# Patient Record
Sex: Female | Born: 1995 | Race: Black or African American | Hispanic: No | Marital: Single | Smoking: Never smoker
Health system: Southern US, Community
[De-identification: ages and names within clinical notes are randomized; demographics above are authoritative.]

## PROBLEM LIST (undated history)

## (undated) ENCOUNTER — Inpatient Hospital Stay (HOSPITAL_COMMUNITY): Payer: Self-pay

## (undated) DIAGNOSIS — O139 Gestational [pregnancy-induced] hypertension without significant proteinuria, unspecified trimester: Secondary | ICD-10-CM

## (undated) DIAGNOSIS — F419 Anxiety disorder, unspecified: Secondary | ICD-10-CM

## (undated) HISTORY — PX: NO PAST SURGERIES: SHX2092

---
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
## 2015-02-20 ENCOUNTER — Encounter (HOSPITAL_COMMUNITY): Payer: Self-pay | Admitting: Family Medicine

## 2015-02-20 ENCOUNTER — Emergency Department (INDEPENDENT_AMBULATORY_CARE_PROVIDER_SITE_OTHER)
Admission: EM | Admit: 2015-02-20 | Discharge: 2015-02-20 | Disposition: A | Discharge status: Home / Self Care | Payer: Self-pay | Source: Home / Self Care | Attending: Family Medicine | Admitting: Family Medicine

## 2015-02-20 DIAGNOSIS — A084 Viral intestinal infection, unspecified: Secondary | ICD-10-CM

## 2015-02-20 LAB — POCT URINALYSIS DIP (DEVICE)
Bilirubin Urine: NEGATIVE
Glucose, UA: NEGATIVE mg/dL
Hgb urine dipstick: NEGATIVE
Ketones, ur: NEGATIVE mg/dL
Nitrite: NEGATIVE
Protein, ur: 100 mg/dL — AB
Specific Gravity, Urine: >=1.03 (ref 1.005–1.030)
Urobilinogen, UA: 0.2 mg/dL (ref 0.0–1.0)
pH: 6 (ref 5.0–8.0)

## 2015-02-20 LAB — POCT PREGNANCY, URINE: Preg Test, Ur: NEGATIVE

## 2015-02-20 MED ORDER — ONDANSETRON HCL 4 MG PO TABS: 4.0000 mg | ORAL_TABLET | Freq: Three times a day (TID) | ORAL | Status: DC | PRN

## 2015-02-20 NOTE — Discharge Instructions (Signed)
You likely have a viral illness. This should go away in 1-3 days Please use the zofran for nausea and vomiting Please use birth control every time.

## 2015-02-20 NOTE — ED Provider Notes (Signed)
CSN: [id number]     Arrival date & time 02/20/15  1300 History   First MD Initiated Contact with Patient 02/20/15 1322     Chief Complaint  Patient presents with  . Emesis   (Consider location/radiation/quality/duration/timing/severity/associated sxs/prior Treatment) HPI  Nausea: started today. No different then at onset.  Associated w/ mild dizziness and flush feeling  Started after getting to work nyquil for first time last night 3 bouts of non-bloody non-bilious emesis today   LMP 2 wks ago.  Sexually active Condoms  intermittently Came off birth control several months ago  Denies dysuria or frequency abd pain, fever, HA.     History reviewed. No pertinent past medical history. History reviewed. No pertinent past surgical history. Family History  Problem Relation Age of Onset  . Hypertension Other    Social History  Substance Use Topics  . Smoking status: Never Smoker   . Smokeless tobacco: None  . Alcohol Use: No   OB History    No data available     Review of Systems Per HPI with all other pertinent systems negative.   Allergies  Review of patient's allergies indicates no known allergies.  Home Medications   Prior to Admission medications   Medication Sig Start Date End Date Taking? Authorizing Provider  ondansetron (ZOFRAN) 4 MG tablet Take 1 tablet (4 mg total) by mouth every 8 (eight) hours as needed for nausea or vomiting. 02/20/15   Ozella Rocksavid J Tranesha Lessner, MD   Meds Ordered and Administered this Visit  Medications - No data to display  BP 104/70 mmHg  Pulse 112  Temp(Src) 98.9 F (37.2 C) (Oral)  Resp 12  SpO2 100% No data found.   Physical Exam Physical Exam  Constitutional: oriented to person, place, and time. appears well-developed and well-nourished. No distress.  HENT:  Head: Normocephalic and atraumatic.  Eyes: EOMI. PERRL.  Neck: Normal range of motion.  Cardiovascular: RRR, no m/r/g, 2+ distal pulses,  Pulmonary/Chest: Effort  normal and breath sounds normal. No respiratory distress.  Abdominal: Soft. Bowel sounds are normal. NonTTP, no distension.  Musculoskeletal: Normal range of motion. Non ttp, no effusion.  Neurological: alert and oriented to person, place, and time.  Skin: Skin is warm. No rash noted. non diaphoretic.  Psychiatric: normal mood and affect. behavior is normal. Judgment and thought content normal.   ED Course  Procedures (including critical care time)  Labs Review Labs Reviewed  POCT URINALYSIS DIP (DEVICE) - Abnormal; Notable for the following:    Protein, ur 100 (*)    Leukocytes, UA TRACE (*)    All other components within normal limits  POCT PREGNANCY, URINE    Imaging Review No results found.   Visual Acuity Review  Right Eye Distance:   Left Eye Distance:   Bilateral Distance:    Right Eye Near:   Left Eye Near:    Bilateral Near:         MDM   1. Viral gastroenteritis    zofran Fluids Rest Work  Note provided    Ozella Rocksavid J Kailee Essman, MD 02/20/15 1341

## 2015-02-20 NOTE — ED Notes (Signed)
Reports vomiting this morning, now feeling dizzy, having hot flashes.  Stomach pain, sore throat.

## 2016-02-17 ENCOUNTER — Other Ambulatory Visit (HOSPITAL_COMMUNITY): Payer: Self-pay | Admitting: Nurse Practitioner

## 2016-02-17 DIAGNOSIS — IMO0002 Reserved for concepts with insufficient information to code with codable children: Secondary | ICD-10-CM

## 2016-02-17 DIAGNOSIS — O351XX0 Maternal care for (suspected) chromosomal abnormality in fetus, not applicable or unspecified: Secondary | ICD-10-CM

## 2016-02-17 DIAGNOSIS — Z3A12 12 weeks gestation of pregnancy: Secondary | ICD-10-CM

## 2016-02-17 LAB — OB RESULTS CONSOLE GC/CHLAMYDIA
CHLAMYDIA, DNA PROBE: POSITIVE
Gonorrhea: NEGATIVE

## 2016-02-17 LAB — OB RESULTS CONSOLE RUBELLA ANTIBODY, IGM: RUBELLA: IMMUNE

## 2016-02-17 LAB — OB RESULTS CONSOLE HEPATITIS B SURFACE ANTIGEN: HEP B S AG: NEGATIVE

## 2016-02-17 LAB — OB RESULTS CONSOLE HIV ANTIBODY (ROUTINE TESTING): HIV: NONREACTIVE

## 2016-02-17 LAB — OB RESULTS CONSOLE ABO/RH: RH TYPE: POSITIVE

## 2016-02-17 LAB — OB RESULTS CONSOLE RPR: RPR: NONREACTIVE

## 2016-02-25 ENCOUNTER — Encounter (HOSPITAL_COMMUNITY): Payer: Self-pay | Admitting: Nurse Practitioner

## 2016-02-27 ENCOUNTER — Inpatient Hospital Stay (HOSPITAL_COMMUNITY)
Admission: AD | Admit: 2016-02-27 | Discharge: 2016-02-27 | Disposition: A | Discharge status: Home / Self Care | Payer: Medicaid Other | Source: Ambulatory Visit | Attending: Obstetrics & Gynecology | Admitting: Obstetrics & Gynecology

## 2016-02-27 ENCOUNTER — Encounter (HOSPITAL_COMMUNITY): Payer: Self-pay | Admitting: *Deleted

## 2016-02-27 DIAGNOSIS — B3731 Acute candidiasis of vulva and vagina: Secondary | ICD-10-CM

## 2016-02-27 DIAGNOSIS — B373 Candidiasis of vulva and vagina: Secondary | ICD-10-CM | POA: Insufficient documentation

## 2016-02-27 DIAGNOSIS — Z3A12 12 weeks gestation of pregnancy: Secondary | ICD-10-CM | POA: Diagnosis not present

## 2016-02-27 DIAGNOSIS — O98811 Other maternal infectious and parasitic diseases complicating pregnancy, first trimester: Secondary | ICD-10-CM | POA: Diagnosis not present

## 2016-02-27 DIAGNOSIS — R42 Dizziness and giddiness: Secondary | ICD-10-CM | POA: Diagnosis present

## 2016-02-27 LAB — URINE MICROSCOPIC-ADD ON

## 2016-02-27 LAB — POCT PREGNANCY, URINE: Preg Test, Ur: POSITIVE — AB

## 2016-02-27 LAB — URINALYSIS, ROUTINE W REFLEX MICROSCOPIC
Bilirubin Urine: NEGATIVE
GLUCOSE, UA: NEGATIVE mg/dL
KETONES UR: NEGATIVE mg/dL
NITRITE: NEGATIVE
PROTEIN: NEGATIVE mg/dL
Specific Gravity, Urine: 1.02 (ref 1.005–1.030)
pH: 7 (ref 5.0–8.0)

## 2016-02-27 MED ORDER — TERCONAZOLE 0.8 % VA CREA: 1.0000 | TOPICAL_CREAM | Freq: Every day | VAGINAL | 0 refills | Status: DC

## 2016-02-27 NOTE — MAU Provider Note (Signed)
History     CSN: [id number]  Arrival date and time: 02/27/16 1117   First Provider Initiated Contact with Patient 02/27/16 1309      Chief Complaint  Patient presents with  . Dizziness   HPI Grace Becker is a 20 y.o. G1P0 at [redacted]w[redacted]d who presents for episode of dizziness this morning. Episode occurred upon wakening & was associated with headache and feeling of warmth. Lasted for less than a minute. No symptoms since then. Denies n/v/d, constipation, fever, chest pain, palpitations, abdominal pain, vaginal bleeding, vaginal discharge, or dysuria. Pt goes to [facility] & is scheduled for ultrasound next week.   OB History    Gravida Para Term Preterm AB Living   1             SAB TAB Ectopic Multiple Live Births                  Past Medical History:  Diagnosis Date  . Medical history non-contributory     Past Surgical History:  Procedure Laterality Date  . NO PAST SURGERIES      Family History  Problem Relation Age of Onset  . Hypertension Other     Social History  Substance Use Topics  . Smoking status: Never Smoker  . Smokeless tobacco: Never Used  . Alcohol use No    Allergies: No Known Allergies  Prescriptions Prior to Admission  Medication Sig Dispense Refill Last Dose  . ondansetron (ZOFRAN) 4 MG tablet Take 1 tablet (4 mg total) by mouth every 8 (eight) hours as needed for nausea or vomiting. 20 tablet 0     Review of Systems  Constitutional: Negative.   Respiratory: Negative for shortness of breath.   Cardiovascular: Negative for chest pain and palpitations.  Gastrointestinal: Negative.   Genitourinary: Negative.   Neurological: Positive for dizziness (lasted for a few minutes, since resolved) and headaches (lasted for a few minutes; since resolved).   Physical Exam   Blood pressure 120/65, pulse 92, temperature 98.7 F (37.1 C), temperature source Oral, resp. rate 18, height 5' (1.524 m), weight 98 lb 12.8 oz (44.8 kg), last menstrual period  12/04/2015.  Physical Exam  Nursing note and vitals reviewed. Constitutional: She is oriented to person, place, and time. She appears well-developed and well-nourished. No distress.  HENT:  Head: Normocephalic and atraumatic.  Eyes: Conjunctivae are normal. Right eye exhibits no discharge. Left eye exhibits no discharge. No scleral icterus.  Neck: Normal range of motion.  Cardiovascular: Normal rate, regular rhythm and normal heart sounds.   No murmur heard. Respiratory: Effort normal and breath sounds normal. No respiratory distress. She has no wheezes.  GI: Soft. Bowel sounds are normal. There is no tenderness.  Neurological: She is alert and oriented to person, place, and time.  Skin: Skin is warm and dry. She is not diaphoretic.  Psychiatric: She has a normal mood and affect. Her behavior is normal. Judgment and thought content normal.    MAU Course  Procedures Results for orders placed or performed during the hospital encounter of 02/27/16 (from the past 24 hour(s))  Urinalysis, Routine w reflex microscopic (not at [facility])     Status: Abnormal   Collection Time: 02/27/16 11:45 AM  Result Value Ref Range   Color, Urine YELLOW YELLOW   APPearance CLOUDY (A) CLEAR   Specific Gravity, Urine 1.020 1.005 - 1.030   pH 7.0 5.0 - 8.0   Glucose, UA NEGATIVE NEGATIVE mg/dL   Hgb urine dipstick TRACE (  A) NEGATIVE   Bilirubin Urine NEGATIVE NEGATIVE   Ketones, ur NEGATIVE NEGATIVE mg/dL   Protein, ur NEGATIVE NEGATIVE mg/dL   Nitrite NEGATIVE NEGATIVE   Leukocytes, UA LARGE (A) NEGATIVE  Urine microscopic-add on     Status: Abnormal   Collection Time: 02/27/16 11:45 AM  Result Value Ref Range   Squamous Epithelial / LPF 6-30 (A) NONE SEEN   WBC, UA 6-30 0 - 5 WBC/hpf   RBC / HPF 0-5 0 - 5 RBC/hpf   Bacteria, UA MANY (A) NONE SEEN   Urine-Other AMORPHOUS URATES/PHOSPHATES   Pregnancy, urine POC     Status: Abnormal   Collection Time: 02/27/16 12:30 PM  Result Value Ref Range   Preg  Test, Ur POSITIVE (A) NEGATIVE    MDM FHT 160 by doppler VSS NAD Pt declined blood work; states she just wanted to make sure everything was ok and requesting ultrasound -- discussed this isn't necessary as we confirmed viability with doppler Yeast in urine micro; denies symptoms but acceptable of treatment Assessment and Plan  A; 1. Vaginal yeast infection    P; Discharge home Rx terazol Keep scheduled appt with [facility] & ultrasound Discussed reasons to return to MAU  Judeth HornErin Adelai Achey 02/27/2016, 1:09 PM

## 2016-02-27 NOTE — Discharge Instructions (Signed)

## 2016-03-03 ENCOUNTER — Ambulatory Visit (HOSPITAL_COMMUNITY): Payer: Self-pay

## 2016-03-03 ENCOUNTER — Ambulatory Visit (HOSPITAL_COMMUNITY)
Admission: RE | Admit: 2016-03-03 | Discharge: 2016-03-03 | Disposition: A | Discharge status: Home / Self Care | Payer: Self-pay | Source: Ambulatory Visit | Attending: Nurse Practitioner | Admitting: Nurse Practitioner

## 2016-03-03 ENCOUNTER — Encounter (HOSPITAL_COMMUNITY): Payer: Self-pay

## 2016-05-11 NOTE — L&D Delivery Note (Signed)
Delivery Note  The patient resumed pushing at 8:45 pm, with slow gradual progress as her pushing became more focused. Multiple positions attempted, and with hips flexed she made progress to +4 position, and requested vacuum assistance as progress slowed. At 10:33 PM a viable and healthy female was delivered via Vaginal, Kiwi vacuum assisted vaginal delivery (Presentation: ; OA ).  APGAR: 8, 9; weight  .  pending Placenta status: , .  Cord:  with the following complications: .  Cord pH: not done  Anesthesia:  Epidural, local for left labia majora repair. Several 1st degree tears,  subclitoral in midline, not bleeding and not warraning repair. Episiotomy: None Lacerations:   Suture Repair: vicryl 4-0 on SH Est. Blood Loss (mL):    Mom to postpartum.  Baby to Couplet care / Skin to Skin.  FERGUSON,JOHN V 09/11/2016, 10:52 PM

## 2016-09-01 ENCOUNTER — Inpatient Hospital Stay (HOSPITAL_COMMUNITY)
Admission: AD | Admit: 2016-09-01 | Discharge: 2016-09-01 | Disposition: A | Discharge status: Home / Self Care | Payer: Medicaid Other | Source: Ambulatory Visit | Attending: Family Medicine | Admitting: Family Medicine

## 2016-09-01 ENCOUNTER — Encounter (HOSPITAL_COMMUNITY): Payer: Self-pay | Admitting: *Deleted

## 2016-09-01 DIAGNOSIS — Z3A00 Weeks of gestation of pregnancy not specified: Secondary | ICD-10-CM | POA: Insufficient documentation

## 2016-09-01 DIAGNOSIS — O479 False labor, unspecified: Secondary | ICD-10-CM | POA: Insufficient documentation

## 2016-09-01 NOTE — Progress Notes (Signed)
I have communicated with Dr. Omer Jack and reviewed vital signs:  Vitals:   09/01/16 1421 09/01/16 1527  BP: 126/80 125/76  Pulse: 93 94  Resp: 18 16  Temp: 98.2 F (36.8 C)     Vaginal exam:  Dilation: 1 Effacement (%): Thick Cervical Position: Middle Station: -3 Presentation: Vertex Exam by:: A. Brandace Cargle, RN,   Also reviewed contraction pattern and that non-stress test is reactive.  It has been documented that patient is contracting every 7-10 minutes with cervical exam of 1/thick/-3 not indicating active labor.  Patient denies any other complaints.  Based on this report provider has given order for discharge.  A discharge order and diagnosis entered by a provider.   Labor discharge instructions reviewed with patient.

## 2016-09-01 NOTE — MAU Note (Signed)
Pt C/O lower abd pain that started around noon, thinks it may be contractions.  Denies bleeding or LOF.

## 2016-09-01 NOTE — Discharge Instructions (Signed)
Braxton Hicks Contractions °Contractions of the uterus can occur throughout pregnancy, but they are not always a sign that you are in labor. You may have practice contractions called Braxton Hicks contractions. These false labor contractions are sometimes confused with true labor. °What are Braxton Hicks contractions? °Braxton Hicks contractions are tightening movements that occur in the muscles of the uterus before labor. Unlike true labor contractions, these contractions do not result in opening (dilation) and thinning of the cervix. Toward the end of pregnancy (32-34 weeks), Braxton Hicks contractions can happen more often and may become stronger. These contractions are sometimes difficult to tell apart from true labor because they can be very uncomfortable. You should not feel embarrassed if you go to the hospital with false labor. °Sometimes, the only way to tell if you are in true labor is for your health care provider to look for changes in the cervix. The health care provider will do a physical exam and may monitor your contractions. If you are not in true labor, the exam should show that your cervix is not dilating and your water has not broken. °If there are no prenatal problems or other health problems associated with your pregnancy, it is completely safe for you to be sent home with false labor. You may continue to have Braxton Hicks contractions until you go into true labor. °How can I tell the difference between true labor and false labor? °· Differences °¨ False labor °¨ Contractions last 30-70 seconds.: Contractions are usually shorter and not as strong as true labor contractions. °¨ Contractions become very regular.: Contractions are usually irregular. °¨ Discomfort is usually felt in the top of the uterus, and it spreads to the lower abdomen and low back.: Contractions are often felt in the front of the lower abdomen and in the groin. °¨ Contractions do not go away with walking.: Contractions may  go away when you walk around or change positions while lying down. °¨ Contractions usually become more intense and increase in frequency.: Contractions get weaker and are shorter-lasting as time goes on. °¨ The cervix dilates and gets thinner.: The cervix usually does not dilate or become thin. °Follow these instructions at home: °¨ Take over-the-counter and prescription medicines only as told by your health care provider. °¨ Keep up with your usual exercises and follow other instructions from your health care provider. °¨ Eat and drink lightly if you think you are going into labor. °¨ If Braxton Hicks contractions are making you uncomfortable: °¨ Change your position from lying down or resting to walking, or change from walking to resting. °¨ Sit and rest in a tub of warm water. °¨ Drink enough fluid to keep your urine clear or pale yellow. Dehydration may cause these contractions. °¨ Do slow and deep breathing several times an hour. °¨ Keep all follow-up prenatal visits as told by your health care provider. This is important. °Contact a health care provider if: °¨ You have a fever. °¨ You have continuous pain in your abdomen. °Get help right away if: °¨ Your contractions become stronger, more regular, and closer together. °¨ You have fluid leaking or gushing from your vagina. °¨ You pass blood-tinged mucus (bloody show). °¨ You have bleeding from your vagina. °¨ You have low back pain that you never had before. °¨ You feel your baby’s head pushing down and causing pelvic pressure. °¨ Your baby is not moving inside you as much as it used to. °Summary °¨ Contractions that occur before labor are   called Braxton Hicks contractions, false labor, or practice contractions. °¨ Braxton Hicks contractions are usually shorter, weaker, farther apart, and less regular than true labor contractions. True labor contractions usually become progressively stronger and regular and they become more frequent. °¨ Manage discomfort from  Braxton Hicks contractions by changing position, resting in a warm bath, drinking plenty of water, or practicing deep breathing. °This information is not intended to replace advice given to you by your health care provider. Make sure you discuss any questions you have with your health care provider. °Document Released: 04/27/2005 Document Revised: 03/16/2016 Document Reviewed: 03/16/2016 °Elsevier Interactive Patient Education © 2017 [employer] °Fetal Movement Counts °Patient Name: ________________________________________________ Patient Due Date: ____________________ °What is a fetal movement count? °A fetal movement count is the number of times that you feel your baby move during a certain amount of time. This may also be called a fetal kick count. A fetal movement count is recommended for every pregnant woman. You may be asked to start counting fetal movements as early as week 28 of your pregnancy. °Pay attention to when your baby is most active. You may notice your baby's sleep and wake cycles. You may also notice things that make your baby move more. You should do a fetal movement count: °· When your baby is normally most active. °· At the same time each day. °A good time to count movements is while you are resting, after having something to eat and drink. °How do I count fetal movements? °1. Find a quiet, comfortable area. Sit, or lie down on your side. °2. Write down the date, the start time and stop time, and the number of movements that you felt between those two times. Take this information with you to your health care visits. °3. For 2 hours, count kicks, flutters, swishes, rolls, and jabs. You should feel at least 10 movements during 2 hours. °4. You may stop counting after you have felt 10 movements. °5. If you do not feel 10 movements in 2 hours, have something to eat and drink. Then, keep resting and counting for 1 hour. If you feel at least 4 movements during that hour, you may stop  counting. °Contact a health care provider if: °· You feel fewer than 4 movements in 2 hours. °· Your baby is not moving like he or she usually does. °Date: ____________ Start time: ____________ Stop time: ____________ Movements: ____________ °Date: ____________ Start time: ____________ Stop time: ____________ Movements: ____________ °Date: ____________ Start time: ____________ Stop time: ____________ Movements: ____________ °Date: ____________ Start time: ____________ Stop time: ____________ Movements: ____________ °Date: ____________ Start time: ____________ Stop time: ____________ Movements: ____________ °Date: ____________ Start time: ____________ Stop time: ____________ Movements: ____________ °Date: ____________ Start time: ____________ Stop time: ____________ Movements: ____________ °Date: ____________ Start time: ____________ Stop time: ____________ Movements: ____________ °Date: ____________ Start time: ____________ Stop time: ____________ Movements: ____________ °This information is not intended to replace advice given to you by your health care provider. Make sure you discuss any questions you have with your health care provider. °Document Released: 05/27/2006 Document Revised: 12/25/2015 Document Reviewed: 06/06/2015 °Elsevier Interactive Patient Education © 2017 [employer] Inc[employer] ° °

## 2016-09-07 LAB — OB RESULTS CONSOLE GBS: GBS: NEGATIVE

## 2016-09-09 ENCOUNTER — Ambulatory Visit (INDEPENDENT_AMBULATORY_CARE_PROVIDER_SITE_OTHER): Payer: Medicaid Other | Admitting: *Deleted

## 2016-09-09 ENCOUNTER — Ambulatory Visit: Payer: Self-pay

## 2016-09-09 VITALS — BP 125/77 | HR 76

## 2016-09-09 DIAGNOSIS — O480 Post-term pregnancy: Secondary | ICD-10-CM

## 2016-09-09 NOTE — Progress Notes (Signed)
Pt receives prenatal care @ [facility] and next visit is 09/11/16.  IOL scheduled on 5/8 @ 0730.  Labor precautions reviewed and pt voiced understanding. Copy of NST report faxed to [facility].

## 2016-09-09 NOTE — Progress Notes (Signed)
NST performed today was reviewed and was found to be reactive.  AFI was also normal.  Continue recommended antenatal testing and prenatal care.   Tereso Newcomer, MD

## 2016-09-10 ENCOUNTER — Encounter (HOSPITAL_COMMUNITY): Payer: Self-pay

## 2016-09-10 ENCOUNTER — Inpatient Hospital Stay (HOSPITAL_COMMUNITY)
Admission: AD | Admit: 2016-09-10 | Discharge: 2016-09-13 | DRG: 774 | Disposition: A | Discharge status: Home / Self Care | Payer: Medicaid Other | Source: Ambulatory Visit | Attending: Obstetrics and Gynecology | Admitting: Obstetrics and Gynecology

## 2016-09-10 DIAGNOSIS — O4290 Premature rupture of membranes, unspecified as to length of time between rupture and onset of labor, unspecified weeks of gestation: Secondary | ICD-10-CM | POA: Diagnosis present

## 2016-09-10 DIAGNOSIS — O4212 Full-term premature rupture of membranes, onset of labor more than 24 hours following rupture: Secondary | ICD-10-CM

## 2016-09-10 DIAGNOSIS — Z3A40 40 weeks gestation of pregnancy: Secondary | ICD-10-CM

## 2016-09-10 DIAGNOSIS — O4292 Full-term premature rupture of membranes, unspecified as to length of time between rupture and onset of labor: Secondary | ICD-10-CM | POA: Diagnosis present

## 2016-09-10 DIAGNOSIS — O4202 Full-term premature rupture of membranes, onset of labor within 24 hours of rupture: Secondary | ICD-10-CM

## 2016-09-10 DIAGNOSIS — D72829 Elevated white blood cell count, unspecified: Secondary | ICD-10-CM | POA: Diagnosis present

## 2016-09-10 DIAGNOSIS — O1495 Unspecified pre-eclampsia, complicating the puerperium: Secondary | ICD-10-CM | POA: Diagnosis not present

## 2016-09-10 LAB — POCT FERN TEST: POCT FERN TEST: POSITIVE

## 2016-09-10 NOTE — H&P (Signed)
Grace Becker is a 21 y.o. female presenting for contractions for 2 hours. While here she ruptured her membranes.  Cervix was 1cm yesterday. States UCs are very painful  RN Note: PT reports contractions that started this morning and now every 5 mins x2 hours. Pt denies LOF. Has had some spotting. States cervix was 1cm yesterday. Reports good fetal movement.     Electronically signed by Brand Males     . OB History    Gravida Para Term Preterm AB Living   1             SAB TAB Ectopic Multiple Live Births                 Past Medical History:  Diagnosis Date  . Medical history non-contributory    Past Surgical History:  Procedure Laterality Date  . NO PAST SURGERIES     Family History: family history includes Hypertension in her other. Social History:  reports that she has never smoked. She has never used smokeless tobacco. She reports that she does not drink alcohol or use drugs.     Maternal Diabetes: No Genetic Screening: Normal Maternal Ultrasounds/Referrals: Normal Fetal Ultrasounds or other Referrals:  None Maternal Substance Abuse:  No Significant Maternal Medications:  None Significant Maternal Lab Results:  Lab values include: Group B Strep negative Other Comments:  None  Review of Systems  Constitutional: Negative for chills, fever and malaise/fatigue.  Respiratory: Negative for shortness of breath.   Cardiovascular: Negative for leg swelling.  Gastrointestinal: Positive for abdominal pain. Negative for constipation, diarrhea, nausea and vomiting.  Genitourinary: Negative for dysuria.   Maternal Medical History:  Reason for admission: Rupture of membranes and contractions.  Nausea.  Contractions: Onset was 1-2 hours ago.   Frequency: regular.   Perceived severity is moderate.    Fetal activity: Perceived fetal activity is normal.   Last perceived fetal movement was within the past hour.    Prenatal complications: Bleeding.   No PIH,  pre-eclampsia or preterm labor.   Prenatal Complications - Diabetes: none.    Dilation: 2 Effacement (%): 70 Station: -3 Exam by:: Camelia Eng RN Blood pressure 134/85, pulse 78, temperature 98.4 F (36.9 C), temperature source Oral, resp. rate 18, height 5\' 2"  (1.575 m), weight 146 lb (66.2 kg), last menstrual period 12/03/2015, SpO2 100 %. Maternal Exam:  Uterine Assessment: Contraction strength is moderate.  Contraction frequency is regular.   Abdomen: Patient reports no abdominal tenderness. Fundal height is 39.   Fetal presentation: vertex  Introitus: Normal vulva. Normal vagina.  Ferning test: positive.  Nitrazine test: not done. Amniotic fluid character: clear.  Pelvis: adequate for delivery.   Cervix: Cervix evaluated by digital exam.     Fetal Exam Fetal Monitor Review: Mode: ultrasound.   Baseline rate: 140.  Variability: moderate (6-25 bpm).   Pattern: accelerations present and no decelerations.    Fetal State Assessment: Category I - tracings are normal.     Physical Exam  Constitutional: She is oriented to person, place, and time. She appears well-developed and well-nourished. No distress.  HENT:  Head: Normocephalic.  Cardiovascular: Normal rate and regular rhythm.   Respiratory: Effort normal. No respiratory distress.  GI: Soft. She exhibits no distension. There is no tenderness. There is no rebound and no guarding.  Genitourinary:  Genitourinary Comments: Dilation: 2 Effacement (%): 70 Cervical Position: Middle Station: -3 Exam by:: Camelia Eng RN   Musculoskeletal: Normal range of motion.  Neurological: She  is alert and oriented to person, place, and time.  Skin: Skin is warm and dry.  Psychiatric: She has a normal mood and affect.    Prenatal labs: ABO, Rh:   Antibody:   Rubella:   RPR:    HBsAg:    HIV:    GBS: Negative (04/30 0000)   Assessment/Plan: Single IUP at 4769w2d Latent phase labor PROM at term  [facility] Rout[facility] orders   Wynelle BourgeoisMarie Williams 09/10/2016, 11:56 PM

## 2016-09-10 NOTE — MAU Note (Signed)
PT reports contractions that started this morning and now every 5 mins x2 hours. Pt denies LOF. Has had some spotting. States cervix was 1cm yesterday. Reports good fetal movement.

## 2016-09-11 ENCOUNTER — Inpatient Hospital Stay (HOSPITAL_COMMUNITY): Payer: Medicaid Other | Admitting: Anesthesiology

## 2016-09-11 ENCOUNTER — Encounter (HOSPITAL_COMMUNITY): Payer: Self-pay

## 2016-09-11 DIAGNOSIS — O4292 Full-term premature rupture of membranes, unspecified as to length of time between rupture and onset of labor: Secondary | ICD-10-CM | POA: Diagnosis present

## 2016-09-11 DIAGNOSIS — O165 Unspecified maternal hypertension, complicating the puerperium: Secondary | ICD-10-CM | POA: Diagnosis present

## 2016-09-11 DIAGNOSIS — Z3493 Encounter for supervision of normal pregnancy, unspecified, third trimester: Secondary | ICD-10-CM | POA: Diagnosis present

## 2016-09-11 DIAGNOSIS — R0602 Shortness of breath: Secondary | ICD-10-CM | POA: Diagnosis not present

## 2016-09-11 DIAGNOSIS — O1495 Unspecified pre-eclampsia, complicating the puerperium: Secondary | ICD-10-CM | POA: Diagnosis not present

## 2016-09-11 DIAGNOSIS — Z8249 Family history of ischemic heart disease and other diseases of the circulatory system: Secondary | ICD-10-CM | POA: Diagnosis not present

## 2016-09-11 DIAGNOSIS — O4290 Premature rupture of membranes, unspecified as to length of time between rupture and onset of labor, unspecified weeks of gestation: Secondary | ICD-10-CM | POA: Diagnosis present

## 2016-09-11 DIAGNOSIS — Z79899 Other long term (current) drug therapy: Secondary | ICD-10-CM | POA: Diagnosis not present

## 2016-09-11 DIAGNOSIS — D72829 Elevated white blood cell count, unspecified: Secondary | ICD-10-CM | POA: Diagnosis present

## 2016-09-11 DIAGNOSIS — Z3A40 40 weeks gestation of pregnancy: Secondary | ICD-10-CM | POA: Diagnosis not present

## 2016-09-11 LAB — COMPREHENSIVE METABOLIC PANEL
ALBUMIN: 2.9 g/dL — AB (ref 3.5–5.0)
ALK PHOS: 133 U/L — AB (ref 38–126)
ALT: 10 U/L — AB (ref 14–54)
AST: 22 U/L (ref 15–41)
Anion gap: 10 (ref 5–15)
BILIRUBIN TOTAL: 0.8 mg/dL (ref 0.3–1.2)
CALCIUM: 8.4 mg/dL — AB (ref 8.9–10.3)
CO2: 20 mmol/L — ABNORMAL LOW (ref 22–32)
CREATININE: 0.6 mg/dL (ref 0.44–1.00)
Chloride: 102 mmol/L (ref 101–111)
GFR calc Af Amer: >60 mL/min (ref >60)
GLUCOSE: 97 mg/dL (ref 65–99)
POTASSIUM: 3.4 mmol/L — AB (ref 3.5–5.1)
Sodium: 132 mmol/L — ABNORMAL LOW (ref 135–145)
TOTAL PROTEIN: 6.8 g/dL (ref 6.5–8.1)

## 2016-09-11 LAB — RPR: RPR Ser Ql: NONREACTIVE

## 2016-09-11 LAB — ABO/RH: ABO/RH(D): A POS

## 2016-09-11 LAB — CBC
HEMATOCRIT: 27.5 % — AB (ref 36.0–46.0)
HEMATOCRIT: 28.4 % — AB (ref 36.0–46.0)
HEMOGLOBIN: 9.1 g/dL — AB (ref 12.0–15.0)
Hemoglobin: 9.2 g/dL — ABNORMAL LOW (ref 12.0–15.0)
MCH: 24.8 pg — ABNORMAL LOW (ref 26.0–34.0)
MCH: 25.5 pg — AB (ref 26.0–34.0)
MCHC: 32.4 g/dL (ref 30.0–36.0)
MCHC: 33.1 g/dL (ref 30.0–36.0)
MCV: 76.5 fL — ABNORMAL LOW (ref 78.0–100.0)
MCV: 77 fL — ABNORMAL LOW (ref 78.0–100.0)
PLATELETS: 220 10*3/uL (ref 150–400)
Platelets: 217 10*3/uL (ref 150–400)
RBC: 3.57 MIL/uL — ABNORMAL LOW (ref 3.87–5.11)
RBC: 3.71 MIL/uL — ABNORMAL LOW (ref 3.87–5.11)
RDW: 15.5 % (ref 11.5–15.5)
RDW: 15.7 % — AB (ref 11.5–15.5)
WBC: 14.1 10*3/uL — ABNORMAL HIGH (ref 4.0–10.5)
WBC: 21.6 10*3/uL — ABNORMAL HIGH (ref 4.0–10.5)

## 2016-09-11 LAB — TYPE AND SCREEN
ABO/RH(D): A POS
ANTIBODY SCREEN: NEGATIVE

## 2016-09-11 LAB — PROTEIN / CREATININE RATIO, URINE
Creatinine, Urine: 62 mg/dL
Protein Creatinine Ratio: 0.79 mg/mg{Cre} — ABNORMAL HIGH (ref 0.00–0.15)
Total Protein, Urine: 49 mg/dL

## 2016-09-11 MED ORDER — PHENYLEPHRINE 40 MCG/ML (10ML) SYRINGE FOR IV PUSH (FOR BLOOD PRESSURE SUPPORT)
80.0000 ug | PREFILLED_SYRINGE | INTRAVENOUS | Status: DC | PRN
  Filled 2016-09-11: qty 5

## 2016-09-11 MED ORDER — FENTANYL CITRATE (PF) 100 MCG/2ML IJ SOLN
INTRAMUSCULAR | Status: AC
Start: 2016-09-11 — End: 2016-09-11
  Filled 2016-09-11: qty 2

## 2016-09-11 MED ORDER — LIDOCAINE HCL (PF) 1 % IJ SOLN
30.0000 mL | INTRAMUSCULAR | Status: DC | PRN
  Administered 2016-09-11: 30 mL via SUBCUTANEOUS
  Filled 2016-09-11: qty 30

## 2016-09-11 MED ORDER — FENTANYL 2.5 MCG/ML BUPIVACAINE 1/10 % EPIDURAL INFUSION (WH - ANES)
14.0000 mL/h | INTRAMUSCULAR | Status: DC | PRN
  Administered 2016-09-11 (×3): 14 mL/h via EPIDURAL
  Filled 2016-09-11 (×4): qty 100

## 2016-09-11 MED ORDER — LACTATED RINGERS IV SOLN
500.0000 mL | INTRAVENOUS | Status: DC | PRN
  Administered 2016-09-11: 500 mL via INTRAVENOUS

## 2016-09-11 MED ORDER — MAGNESIUM SULFATE BOLUS VIA INFUSION
4.0000 g | Freq: Once | INTRAVENOUS | Status: AC
  Administered 2016-09-12: 4 g via INTRAVENOUS
  Filled 2016-09-11: qty 500

## 2016-09-11 MED ORDER — OXYTOCIN 40 UNITS IN LACTATED RINGERS INFUSION - SIMPLE MED
1.0000 m[IU]/min | INTRAVENOUS | Status: DC
  Administered 2016-09-11: 2 m[IU]/min via INTRAVENOUS

## 2016-09-11 MED ORDER — LACTATED RINGERS IV SOLN: 500.0000 mL | Freq: Once | INTRAVENOUS | Status: DC

## 2016-09-11 MED ORDER — MAGNESIUM SULFATE 40 G IN LACTATED RINGERS - SIMPLE
2.0000 g/h | INTRAVENOUS | Status: AC
  Administered 2016-09-12 (×2): 2 g/h via INTRAVENOUS
  Filled 2016-09-11 (×2): qty 500

## 2016-09-11 MED ORDER — LABETALOL HCL 5 MG/ML IV SOLN
20.0000 mg | Freq: Once | INTRAVENOUS | Status: DC
  Filled 2016-09-11: qty 4

## 2016-09-11 MED ORDER — OXYCODONE-ACETAMINOPHEN 5-325 MG PO TABS: 1.0000 | ORAL_TABLET | ORAL | Status: DC | PRN

## 2016-09-11 MED ORDER — LACTATED RINGERS IV SOLN
INTRAVENOUS | Status: DC
  Administered 2016-09-11: 07:00:00 via INTRAVENOUS
  Administered 2016-09-11: 125 mL/h via INTRAVENOUS

## 2016-09-11 MED ORDER — FENTANYL CITRATE (PF) 100 MCG/2ML IJ SOLN
100.0000 ug | Freq: Once | INTRAMUSCULAR | Status: AC
  Administered 2016-09-11: 100 ug via INTRAVENOUS

## 2016-09-11 MED ORDER — PHENYLEPHRINE 40 MCG/ML (10ML) SYRINGE FOR IV PUSH (FOR BLOOD PRESSURE SUPPORT)
80.0000 ug | PREFILLED_SYRINGE | INTRAVENOUS | Status: DC | PRN
  Filled 2016-09-11: qty 10
  Filled 2016-09-11: qty 5
  Filled 2016-09-11: qty 10

## 2016-09-11 MED ORDER — DIPHENHYDRAMINE HCL 50 MG/ML IJ SOLN: 12.5000 mg | INTRAMUSCULAR | Status: DC | PRN

## 2016-09-11 MED ORDER — OXYCODONE-ACETAMINOPHEN 5-325 MG PO TABS: 2.0000 | ORAL_TABLET | ORAL | Status: DC | PRN

## 2016-09-11 MED ORDER — OXYTOCIN 40 UNITS IN LACTATED RINGERS INFUSION - SIMPLE MED
2.5000 [IU]/h | INTRAVENOUS | Status: DC
  Filled 2016-09-11: qty 1000

## 2016-09-11 MED ORDER — ONDANSETRON HCL 4 MG/2ML IJ SOLN: 4.0000 mg | Freq: Four times a day (QID) | INTRAMUSCULAR | Status: DC | PRN

## 2016-09-11 MED ORDER — ACETAMINOPHEN 325 MG PO TABS: 650.0000 mg | ORAL_TABLET | ORAL | Status: DC | PRN

## 2016-09-11 MED ORDER — OXYTOCIN BOLUS FROM INFUSION
500.0000 mL | Freq: Once | INTRAVENOUS | Status: AC
  Administered 2016-09-11: 500 mL via INTRAVENOUS

## 2016-09-11 MED ORDER — EPHEDRINE 5 MG/ML INJ
10.0000 mg | INTRAVENOUS | Status: DC | PRN
  Filled 2016-09-11: qty 2

## 2016-09-11 MED ORDER — SOD CITRATE-CITRIC ACID 500-334 MG/5ML PO SOLN: 30.0000 mL | ORAL | Status: DC | PRN

## 2016-09-11 MED ORDER — TERBUTALINE SULFATE 1 MG/ML IJ SOLN: 0.2500 mg | Freq: Once | INTRAMUSCULAR | Status: DC | PRN

## 2016-09-11 MED ORDER — LIDOCAINE HCL (PF) 1 % IJ SOLN
INTRAMUSCULAR | Status: DC | PRN
Start: 2016-09-11 — End: 2016-09-11
  Administered 2016-09-11: 7 mL via EPIDURAL
  Administered 2016-09-11: 5 mL via EPIDURAL

## 2016-09-11 NOTE — Progress Notes (Signed)
Delivery Note  The patient resumed pushing at 8:45 pm, with slow gradual progress as her pushing became more focused. Multiple positions attempted, and with hips flexed she made progress to +4 position, and requested vacuum assistance as progress slowed. At 10:33 PM a viable and healthy female was delivered via Vaginal, Kiwi vacuum assisted vaginal delivery (Presentation: ; OA ).  APGAR: 8, 9; weight  .  pending Placenta status: , .  Cord:  with the following complications: .  Cord pH: not done  Anesthesia:  Epidural, local for left labia majora repair. Several 1st degree tears,  subclitoral in midline, not bleeding and not warraning repair. Episiotomy: None Lacerations:   Suture Repair: vicryl 4-0 on SH Est. Blood Loss (mL):    Mom to postpartum.  Baby to Couplet care / Skin to Skin.  Grace Becker,Grace Becker 09/11/2016, 10:52 PM  Patient ID: Grace Becker, female   DOB: 06/06/1995, 21 y.o.   MRN: [id number]

## 2016-09-11 NOTE — Anesthesia Preprocedure Evaluation (Signed)
Anesthesia Evaluation  Patient identified by MRN, date of birth, ID band Patient awake    Reviewed: Allergy & Precautions, H&P , NPO status , Patient's Chart, lab work & pertinent test results  Airway Mallampati: I  TM Distance: >3 FB Neck ROM: full    Dental no notable dental hx.    Pulmonary neg pulmonary ROS,    Pulmonary exam normal breath sounds clear to auscultation       Cardiovascular negative cardio ROS Normal cardiovascular exam Rhythm:Regular Rate:Normal     Neuro/Psych negative neurological ROS  negative psych ROS   GI/Hepatic negative GI ROS, Neg liver ROS,   Endo/Other  negative endocrine ROS  Renal/GU negative Renal ROS  negative genitourinary   Musculoskeletal negative musculoskeletal ROS (+)   Abdominal Normal abdominal exam  (+)   Peds negative pediatric ROS (+)  Hematology negative hematology ROS (+)   Anesthesia Other Findings   Reproductive/Obstetrics (+) Pregnancy                             Anesthesia Physical Anesthesia Plan  ASA: II  Anesthesia Plan: Epidural   Post-op Pain Management:    Induction:   Airway Management Planned:   Additional Equipment:   Intra-op Plan:   Post-operative Plan:   Informed Consent: I have reviewed the patients History and Physical, chart, labs and discussed the procedure including the risks, benefits and alternatives for the proposed anesthesia with the patient or authorized representative who has indicated his/her understanding and acceptance.     Plan Discussed with:   Anesthesia Plan Comments:         Anesthesia Quick Evaluation

## 2016-09-11 NOTE — Progress Notes (Signed)
Patient seen. Doing well. No complaints. Will contineu to monitor. Making cervical change on her own. Will plan to start pitocin if progress stops. No other concerns.   Grace PennaNicholas Lyvonne Cassell, MD 09/11/16 9:37 AM

## 2016-09-11 NOTE — Anesthesia Procedure Notes (Signed)
Epidural Patient location during procedure: OB Start time: 09/11/2016 4:29 AM End time: 09/11/2016 4:33 AM  Staffing Anesthesiologist: Leilani AbleHATCHETT, Linsy Ehresman Performed: anesthesiologist   Preanesthetic Checklist Completed: patient identified, surgical consent, pre-op evaluation, timeout performed, IV checked, risks and benefits discussed and monitors and equipment checked  Epidural Patient position: sitting Prep: site prepped and draped and DuraPrep Patient monitoring: continuous pulse ox and blood pressure Approach: midline Location: L3-L4 Injection technique: LOR air  Needle:  Needle type: Tuohy  Needle gauge: 17 G Needle length: 9 cm and 9 Needle insertion depth: 5 cm cm Catheter type: closed end flexible Catheter size: 19 Gauge Catheter at skin depth: 10 cm Test dose: negative and Other  Assessment Sensory level: T9 Events: blood not aspirated, injection not painful, no injection resistance, negative IV test and no paresthesia  Additional Notes Reason for block:procedure for pain

## 2016-09-11 NOTE — Progress Notes (Signed)
Patient ID: Grace Becker, female   DOB: 04/14/1996, 21 y.o.   MRN: [id number] Hurting more with contractions  Vitals:   09/10/16 2236 09/11/16 0045 09/11/16 0046  BP: 134/85  140/80  Pulse: 78  85  Resp: 18 18   Temp: 98.4 F (36.9 C) 98.4 F (36.9 C)   TempSrc: Oral Oral   SpO2: 100%    Weight: 146 lb (66.2 kg) 146 lb (66.2 kg)   Height: 5\' 2"  (1.575 m) 5\' 2"  (1.575 m)    FHR reassuring UCs every 2-3 min  Dilation: 3 Effacement (%): 90 Cervical Position: Middle Station: -3 Presentation: Vertex Exam by:: Naziah Weckerly CNM  Foley bulb inserted Now wants epidural

## 2016-09-11 NOTE — Anesthesia Pain Management Evaluation Note (Signed)
  CRNA Pain Management Visit Note  Patient: Grace Becker, 21 y.o., female  "Hello I am a member of the anesthesia team at [facility]. We have an anesthesia team available at all times to provide care throughout the hospital, including epidural management and anesthesia for C-section. I don't know your plan for the delivery whether it a natural birth, water birth, IV sedation, nitrous supplementation, doula or epidural, but we want to meet your pain goals."   1.Was your pain managed to your expectations on prior hospitalizations?   No prior hospitalizations  2.What is your expectation for pain management during this hospitalization?     Epidural  3.How can we help you reach that goal?epidural  Record the patient's initial score and the patient's pain goal.   Pain: 0  Pain Goal: 4 [facility] wants you to be able to say your pain was always managed very well.  [name] 09/11/2016

## 2016-09-11 NOTE — Progress Notes (Signed)
Patient seen per nursing report patient is not pushing effectively. She has attempted multiple positions and tug of war with no effect. I pushed with patient through several contractions and noted no effective pushing even with extensive coaching. Will labor down for an how and resume pushing. Asked nursing to avoid patient controlled doses on epidrual during this time.  Ernestina PennaNicholas Schenk, MD 09/11/16 6:30 PM

## 2016-09-12 ENCOUNTER — Encounter (HOSPITAL_COMMUNITY): Payer: Self-pay

## 2016-09-12 DIAGNOSIS — Z3A40 40 weeks gestation of pregnancy: Secondary | ICD-10-CM

## 2016-09-12 LAB — CBC
HCT: 26.8 % — ABNORMAL LOW (ref 36.0–46.0)
Hemoglobin: 8.9 g/dL — ABNORMAL LOW (ref 12.0–15.0)
MCH: 25.3 pg — ABNORMAL LOW (ref 26.0–34.0)
MCHC: 33.2 g/dL (ref 30.0–36.0)
MCV: 76.1 fL — ABNORMAL LOW (ref 78.0–100.0)
PLATELETS: 194 10*3/uL (ref 150–400)
RBC: 3.52 MIL/uL — AB (ref 3.87–5.11)
RDW: 15.6 % — ABNORMAL HIGH (ref 11.5–15.5)
WBC: 31.4 10*3/uL — ABNORMAL HIGH (ref 4.0–10.5)

## 2016-09-12 LAB — URINALYSIS, ROUTINE W REFLEX MICROSCOPIC
Bilirubin Urine: NEGATIVE
Glucose, UA: NEGATIVE mg/dL
Ketones, ur: NEGATIVE mg/dL
NITRITE: NEGATIVE
PROTEIN: 30 mg/dL — AB
SPECIFIC GRAVITY, URINE: 1.006 (ref 1.005–1.030)
pH: 5 (ref 5.0–8.0)

## 2016-09-12 MED ORDER — OXYCODONE HCL 5 MG PO TABS: 5.0000 mg | ORAL_TABLET | ORAL | Status: DC | PRN

## 2016-09-12 MED ORDER — DOCUSATE SODIUM 100 MG PO CAPS
100.0000 mg | ORAL_CAPSULE | Freq: Two times a day (BID) | ORAL | Status: DC
  Administered 2016-09-12 (×2): 100 mg via ORAL
  Filled 2016-09-12 (×3): qty 1

## 2016-09-12 MED ORDER — DIBUCAINE 1 % RE OINT: 1.0000 "application " | TOPICAL_OINTMENT | RECTAL | Status: DC | PRN

## 2016-09-12 MED ORDER — ONDANSETRON HCL 4 MG PO TABS: 4.0000 mg | ORAL_TABLET | ORAL | Status: DC | PRN

## 2016-09-12 MED ORDER — IBUPROFEN 600 MG PO TABS
600.0000 mg | ORAL_TABLET | Freq: Four times a day (QID) | ORAL | Status: DC
  Administered 2016-09-12 – 2016-09-13 (×5): 600 mg via ORAL
  Filled 2016-09-12 (×6): qty 1

## 2016-09-12 MED ORDER — FERROUS SULFATE 325 (65 FE) MG PO TABS
325.0000 mg | ORAL_TABLET | Freq: Two times a day (BID) | ORAL | Status: DC
  Administered 2016-09-12 (×2): 325 mg via ORAL
  Filled 2016-09-12 (×3): qty 1

## 2016-09-12 MED ORDER — TETANUS-DIPHTH-ACELL PERTUSSIS 5-2.5-18.5 LF-MCG/0.5 IM SUSP
0.5000 mL | Freq: Once | INTRAMUSCULAR | Status: AC
  Administered 2016-09-12: 0.5 mL via INTRAMUSCULAR

## 2016-09-12 MED ORDER — BISACODYL 10 MG RE SUPP: 10.0000 mg | Freq: Every day | RECTAL | Status: DC | PRN

## 2016-09-12 MED ORDER — DIPHENHYDRAMINE HCL 25 MG PO CAPS: 25.0000 mg | ORAL_CAPSULE | Freq: Four times a day (QID) | ORAL | Status: DC | PRN

## 2016-09-12 MED ORDER — COCONUT OIL OIL: 1.0000 "application " | TOPICAL_OIL | Status: DC | PRN

## 2016-09-12 MED ORDER — ONDANSETRON HCL 4 MG/2ML IJ SOLN: 4.0000 mg | INTRAMUSCULAR | Status: DC | PRN

## 2016-09-12 MED ORDER — WITCH HAZEL-GLYCERIN EX PADS: 1.0000 "application " | MEDICATED_PAD | CUTANEOUS | Status: DC | PRN

## 2016-09-12 MED ORDER — FLEET ENEMA 7-19 GM/118ML RE ENEM: 1.0000 | ENEMA | Freq: Every day | RECTAL | Status: DC | PRN

## 2016-09-12 MED ORDER — ZOLPIDEM TARTRATE 5 MG PO TABS: 5.0000 mg | ORAL_TABLET | Freq: Every evening | ORAL | Status: DC | PRN

## 2016-09-12 MED ORDER — PRENATAL MULTIVITAMIN CH
1.0000 | ORAL_TABLET | Freq: Every day | ORAL | Status: DC
  Administered 2016-09-12: 1 via ORAL
  Filled 2016-09-12 (×2): qty 1

## 2016-09-12 MED ORDER — LACTATED RINGERS IV SOLN
INTRAVENOUS | Status: DC
  Administered 2016-09-12: 18:00:00 via INTRAVENOUS

## 2016-09-12 MED ORDER — METHYLERGONOVINE MALEATE 0.2 MG PO TABS: 0.2000 mg | ORAL_TABLET | ORAL | Status: DC | PRN

## 2016-09-12 MED ORDER — OXYCODONE HCL 5 MG PO TABS: 10.0000 mg | ORAL_TABLET | ORAL | Status: DC | PRN

## 2016-09-12 MED ORDER — BENZOCAINE-MENTHOL 20-0.5 % EX AERO
1.0000 "application " | INHALATION_SPRAY | CUTANEOUS | Status: DC | PRN
  Administered 2016-09-12: 1 via TOPICAL
  Filled 2016-09-12: qty 56

## 2016-09-12 MED ORDER — SIMETHICONE 80 MG PO CHEW: 80.0000 mg | CHEWABLE_TABLET | ORAL | Status: DC | PRN

## 2016-09-12 MED ORDER — METHYLERGONOVINE MALEATE 0.2 MG/ML IJ SOLN: 0.2000 mg | INTRAMUSCULAR | Status: DC | PRN

## 2016-09-12 MED ORDER — MEASLES, MUMPS & RUBELLA VAC ~~LOC~~ INJ
0.5000 mL | INJECTION | Freq: Once | SUBCUTANEOUS | Status: DC
  Filled 2016-09-12: qty 0.5

## 2016-09-12 MED ORDER — ACETAMINOPHEN 325 MG PO TABS: 650.0000 mg | ORAL_TABLET | ORAL | Status: DC | PRN

## 2016-09-12 NOTE — Anesthesia Postprocedure Evaluation (Signed)
Anesthesia Post Note  Patient: Grace Becker  Procedure(s) Performed: * No procedures listed *  Patient location during evaluation: [facility] Anesthesia Type: Epidural Level of consciousness: awake and alert, oriented and patient cooperative Pain management: pain level controlled Vital Signs Assessment: post-procedure vital signs reviewed and stable Respiratory status: spontaneous breathing Cardiovascular status: stable Postop Assessment: no headache, epidural receding, patient able to bend at knees and no signs of nausea or vomiting Anesthetic complications: no Comments: Pt states she "is not really having any pain".  When asked to give a pain score she states 5.  States pain is manageable.         Last Vitals:  Vitals:   09/12/16 0600 09/12/16 0641  BP: 133/86 134/88  Pulse: 96 97  Resp: 16 18  Temp:      Last Pain:  Vitals:   09/12/16 0638  TempSrc:   PainSc: Asleep   Pain Goal:                 [facility] Center[facility]INKLE,Erisha Paugh

## 2016-09-12 NOTE — Progress Notes (Signed)
Post Partum Day 1 Subjective: no complaints, voiding, tolerating PO and vulvar swelling is impressive, and some bruising on left from VAVD and vulvar swelling that was present during pushing  Pt had PP htn to 180'w over 103, placed on Mag PP. PIH labs normal  Objective: Blood pressure 134/88, pulse 97, temperature 98.2 F (36.8 C), temperature source Oral, resp. rate 18, height 5\' 2"  (1.575 m), weight 66.2 kg (146 lb), last menstrual period 12/03/2015, SpO2 98 %, unknown if currently breastfeeding.  Physical Exam:  General: alert, cooperative and no distress Lochia: appropriate Uterine Fundus: firm at u-4 Incision: vulva repair intact. Swollen and edema reducing DVT Evaluation: No evidence of DVT seen on physical exam. Negative Homan's sign.   Recent Labs  09/11/16 1732 09/12/16 0002  HGB 9.2* 8.9*  HCT 28.4* 26.8*   CMP Latest Ref Rng & Units 09/11/2016  Glucose 65 - 99 mg/dL 97  BUN 6 - 20 mg/dL <9(J<5(L)  Creatinine 4.780.44 - 1.00 mg/dL 2.950.60  Sodium 621135 - 308145 mmol/L 132(L)  Potassium 3.5 - 5.1 mmol/L 3.4(L)  Chloride 101 - 111 mmol/L 102  CO2 22 - 32 mmol/L 20(L)  Calcium 8.9 - 10.3 mg/dL 6.5(H8.4(L)  Total Protein 6.5 - 8.1 g/dL 6.8  Total Bilirubin 0.3 - 1.2 mg/dL 0.8  Alkaline Phos 38 - 126 U/L 133(H)  AST 15 - 41 U/L 22  ALT 14 - 54 U/L 10(L)    Assessment/Plan: PP preeclampsia, placed on Mag PP.for d/c at 23:00 Plan for discharge tomorrow, Breastfeeding and Contraception undecided. pt not interested in discussing this a.m Mag sulfate to d/c at 23:00  LOS: 1 day   Grace Becker V 09/12/2016, 6:58 AM

## 2016-09-12 NOTE — Lactation Note (Signed)
This note was copied from a baby's chart. Lactation Consultation Note  Patient Name: Grace Ballard RussellShaniya Khader ZOXWR'UToday's Date: 09/12/2016 Reason for consult: Initial assessment (per mom plans to ony formula feed . LC noted her preference was down breast / formula )  LC noted baby has only receiving bottles.  LC asked mom what her feeding preference is today and she said her plan was just to bottle feed.    Maternal Data    Feeding    LATCH Score/Interventions                      Lactation Tools Discussed/Used     Consult Status Consult Status: Complete    Kathrin GreathouseMargaret Ann Valentine Barney 09/12/2016, 2:27 PM

## 2016-09-12 NOTE — Progress Notes (Signed)
Possible hematoma noted, perineum heavily swollen. Fresh ice pack applied. Emelda FearFerguson MD notified.

## 2016-09-13 ENCOUNTER — Inpatient Hospital Stay (HOSPITAL_COMMUNITY)
Admission: AD | Admit: 2016-09-13 | Discharge: 2016-09-13 | Disposition: A | Discharge status: Home / Self Care | Payer: Medicaid Other | Source: Ambulatory Visit | Attending: Obstetrics and Gynecology | Admitting: Obstetrics and Gynecology

## 2016-09-13 ENCOUNTER — Encounter (HOSPITAL_COMMUNITY): Payer: Self-pay | Admitting: *Deleted

## 2016-09-13 DIAGNOSIS — O165 Unspecified maternal hypertension, complicating the puerperium: Secondary | ICD-10-CM | POA: Diagnosis not present

## 2016-09-13 DIAGNOSIS — Z8249 Family history of ischemic heart disease and other diseases of the circulatory system: Secondary | ICD-10-CM | POA: Insufficient documentation

## 2016-09-13 DIAGNOSIS — Z79899 Other long term (current) drug therapy: Secondary | ICD-10-CM | POA: Insufficient documentation

## 2016-09-13 DIAGNOSIS — R0602 Shortness of breath: Secondary | ICD-10-CM | POA: Insufficient documentation

## 2016-09-13 HISTORY — DX: Gestational (pregnancy-induced) hypertension without significant proteinuria, unspecified trimester: O13.9

## 2016-09-13 LAB — CBC WITH DIFFERENTIAL/PLATELET
BASOS ABS: 0 10*3/uL (ref 0.0–0.1)
BASOS PCT: 0 %
EOS PCT: 0 %
Eosinophils Absolute: 0 10*3/uL (ref 0.0–0.7)
HCT: 25.3 % — ABNORMAL LOW (ref 36.0–46.0)
Hemoglobin: 8.3 g/dL — ABNORMAL LOW (ref 12.0–15.0)
LYMPHS ABS: 3.7 10*3/uL (ref 0.7–4.0)
Lymphocytes Relative: 13 %
MCH: 24.9 pg — AB (ref 26.0–34.0)
MCHC: 32.8 g/dL (ref 30.0–36.0)
MCV: 75.7 fL — AB (ref 78.0–100.0)
MONO ABS: 0.8 10*3/uL (ref 0.1–1.0)
Monocytes Relative: 3 %
NEUTROS ABS: 23.6 10*3/uL — AB (ref 1.7–7.7)
NEUTROS PCT: 84 %
OTHER: 0 %
PLATELETS: 221 10*3/uL (ref 150–400)
RBC: 3.34 MIL/uL — ABNORMAL LOW (ref 3.87–5.11)
RDW: 15.7 % — AB (ref 11.5–15.5)
WBC: 28.1 10*3/uL — ABNORMAL HIGH (ref 4.0–10.5)

## 2016-09-13 LAB — CBC
HCT: 25.2 % — ABNORMAL LOW (ref 36.0–46.0)
HEMOGLOBIN: 8.2 g/dL — AB (ref 12.0–15.0)
MCH: 24.8 pg — AB (ref 26.0–34.0)
MCHC: 32.5 g/dL (ref 30.0–36.0)
MCV: 76.1 fL — AB (ref 78.0–100.0)
Platelets: 223 10*3/uL (ref 150–400)
RBC: 3.31 MIL/uL — AB (ref 3.87–5.11)
RDW: 15.6 % — ABNORMAL HIGH (ref 11.5–15.5)
WBC: 23.1 10*3/uL — AB (ref 4.0–10.5)

## 2016-09-13 LAB — COMPREHENSIVE METABOLIC PANEL
ALT: 21 U/L (ref 14–54)
ANION GAP: 8 (ref 5–15)
AST: 48 U/L — ABNORMAL HIGH (ref 15–41)
Albumin: 2.8 g/dL — ABNORMAL LOW (ref 3.5–5.0)
Alkaline Phosphatase: 114 U/L (ref 38–126)
BUN: 7 mg/dL (ref 6–20)
CHLORIDE: 102 mmol/L (ref 101–111)
CO2: 24 mmol/L (ref 22–32)
Calcium: 8.4 mg/dL — ABNORMAL LOW (ref 8.9–10.3)
Creatinine, Ser: 0.51 mg/dL (ref 0.44–1.00)
GFR calc non Af Amer: >60 mL/min (ref >60)
Glucose, Bld: 90 mg/dL (ref 65–99)
Potassium: 3.4 mmol/L — ABNORMAL LOW (ref 3.5–5.1)
SODIUM: 134 mmol/L — AB (ref 135–145)
Total Bilirubin: 0.4 mg/dL (ref 0.3–1.2)
Total Protein: 6.5 g/dL (ref 6.5–8.1)

## 2016-09-13 MED ORDER — AMLODIPINE BESYLATE 5 MG PO TABS: 5.0000 mg | ORAL_TABLET | Freq: Every day | ORAL | 11 refills | Status: DC

## 2016-09-13 MED ORDER — TRIAMTERENE-HCTZ 37.5-25 MG PO TABS: 1.0000 | ORAL_TABLET | Freq: Every day | ORAL | 0 refills | Status: DC

## 2016-09-13 MED ORDER — IBUPROFEN 600 MG PO TABS: 600.0000 mg | ORAL_TABLET | Freq: Four times a day (QID) | ORAL | 0 refills | Status: DC

## 2016-09-13 MED ORDER — DOCUSATE SODIUM 100 MG PO CAPS: 100.0000 mg | ORAL_CAPSULE | Freq: Two times a day (BID) | ORAL | 0 refills | Status: DC

## 2016-09-13 NOTE — MAU Provider Note (Signed)
Patient Grace Becker is a 21 year old G1P1001 here with complaints of shortness of breath once she got home from the hospital today after her delivery. Her labor course was complicated by a VAVD and pp HTN with magnesium treatment.  History     CSN: [id number]  Arrival date and time: 09/13/16 1338   First Provider Initiated Contact with Patient 09/13/16 1345      Chief Complaint  Patient presents with  . Shortness of Breath   Shortness of Breath  This is a new problem. The current episode started today. The problem occurs every few minutes. The problem has been resolved. Pertinent negatives include no abdominal pain, chest pain, fever, headaches, vomiting or wheezing. Exacerbated by: she feels like that she may have eaten grapes in the hospital that had come into contact with pineapple, of which she is allergic.  Associated symptoms comments: She describes it less as shortness of breath and more as a throat tightening. She denies any chest pain or difficulty breathing; instead feels as if she throat was getting closed up like when she had pineapple before. . She has tried nothing for the symptoms.    OB History    Gravida Para Term Preterm AB Living   1 1 1     1    SAB TAB Ectopic Multiple Live Births         0 1      Past Medical History:  Diagnosis Date  . Pregnancy induced hypertension     Past Surgical History:  Procedure Laterality Date  . NO PAST SURGERIES      Family History  Problem Relation Age of Onset  . Hypertension Other     Social History  Substance Use Topics  . Smoking status: Never Smoker  . Smokeless tobacco: Never Used  . Alcohol use No    Allergies: No Known Allergies  Prescriptions Prior to Admission  Medication Sig Dispense Refill Last Dose  . docusate sodium (COLACE) 100 MG capsule Take 1 capsule (100 mg total) by mouth 2 (two) times daily. (Patient not taking: Reported on 09/13/2016) 10 capsule 0 Not Taking at Unknown time  . ibuprofen  (ADVIL,MOTRIN) 600 MG tablet Take 1 tablet (600 mg total) by mouth every 6 (six) hours. (Patient not taking: Reported on 09/13/2016) 30 tablet 0 Not Taking at Unknown time    Review of Systems  Constitutional: Negative for fever.  Respiratory: Positive for shortness of breath. Negative for wheezing.   Cardiovascular: Negative for chest pain.  Gastrointestinal: Negative for abdominal pain and vomiting.  Neurological: Negative for headaches.  Denies epigastric pain, HA, blurry vision, swelling, N/V Physical Exam   Blood pressure (!) 150/87, pulse 83, temperature 98.1 F (36.7 C), temperature source Oral, resp. rate 18, SpO2 100 %, unknown if currently breastfeeding.  Physical Exam  Constitutional: She is oriented to person, place, and time. She appears well-developed.  HENT:  Head: Normocephalic.  Neck: Normal range of motion.  Cardiovascular: Normal rate and regular rhythm.   Respiratory: Effort normal. No respiratory distress. She has no wheezes. She has no rales. She exhibits no tenderness.  Musculoskeletal: Normal range of motion.  Neurological: She is alert and oriented to person, place, and time. She has normal reflexes.  Skin: Skin is warm and dry.    MAU Course  Procedures  MDM Results for orders placed or performed during the hospital encounter of 09/13/16 (from the past 24 hour(s))  Comprehensive metabolic panel     Status: Abnormal  Collection Time: 09/13/16  2:21 PM  Result Value Ref Range   Sodium 134 (L) 135 - 145 mmol/L   Potassium 3.4 (L) 3.5 - 5.1 mmol/L   Chloride 102 101 - 111 mmol/L   CO2 24 22 - 32 mmol/L   Glucose, Bld 90 65 - 99 mg/dL   BUN 7 6 - 20 mg/dL   Creatinine, Ser 1.61 0.44 - 1.00 mg/dL   Calcium 8.4 (L) 8.9 - 10.3 mg/dL   Total Protein 6.5 6.5 - 8.1 g/dL   Albumin 2.8 (L) 3.5 - 5.0 g/dL   AST 48 (H) 15 - 41 U/L   ALT 21 14 - 54 U/L   Alkaline Phosphatase 114 38 - 126 U/L   Total Bilirubin 0.4 0.3 - 1.2 mg/dL   GFR calc non Af Amer >60 >60  mL/min   GFR calc Af Amer >60 >60 mL/min   Anion gap 8 5 - 15  CBC     Status: Abnormal   Collection Time: 09/13/16  2:21 PM  Result Value Ref Range   WBC 23.1 (H) 4.0 - 10.5 K/uL   RBC 3.31 (L) 3.87 - 5.11 MIL/uL   Hemoglobin 8.2 (L) 12.0 - 15.0 g/dL   HCT 09.6 (L) 04.5 - 40.9 %   MCV 76.1 (L) 78.0 - 100.0 fL   MCH 24.8 (L) 26.0 - 34.0 pg   MCHC 32.5 30.0 - 36.0 g/dL   RDW 81.1 (H) 91.4 - 78.2 %   Platelets 223 150 - 400 K/uL   -EKG: normal -UPC not sent due to lochia present -patient has had occasional BP systolic values in the 150s/90s, otherwise majority readings are in the 130/90s.   Patient states that her throat swelling has been resolved and she has no pain or difficulty breathing. Currently sitting up in bed talking with FOB and using her phone. Has many questions about getting access to formula for her baby; patient was advised to visit [facility] office tomorrow.   Assessment and Plan   1. Postpartum hypertension    2. Discussed case with Dr. Jolayne Panther; Patient is stable for discharge with RX for Norvasc 5 mg and Maxide.   3. Patient to return to clinic on Tuesday or Wednesday for a follow-up blood pressure check; message sent to clinic.   4. Strict pre-eclamptic precautions given; patient denies HA, blurry vision, epigastric pain at this time. All questions answered and patient verbalized understanding.   Charlesetta Garibaldi Neliah Cuyler CNM 09/13/2016, 3:33 PM

## 2016-09-13 NOTE — Discharge Instructions (Signed)
Etonogestrel implant What is this medicine? ETONOGESTREL (et oh noe JES trel) is a contraceptive (birth control) device. It is used to prevent pregnancy. It can be used for up to 3 years. This medicine may be used for other purposes; ask your health care provider or pharmacist if you have questions. COMMON BRAND NAME(S): Implanon, Nexplanon What should I tell my health care provider before I take this medicine? They need to know if you have any of these conditions: -abnormal vaginal bleeding -blood vessel disease or blood clots -cancer of the breast, cervix, or liver -depression -diabetes -gallbladder disease -headaches -heart disease or recent heart attack -high blood pressure -high cholesterol -kidney disease -liver disease -renal disease -seizures -tobacco smoker -an unusual or allergic reaction to etonogestrel, other hormones, anesthetics or antiseptics, medicines, foods, dyes, or preservatives -pregnant or trying to get pregnant -breast-feeding How should I use this medicine? This device is inserted just under the skin on the inner side of your upper arm by a health care professional. Talk to your pediatrician regarding the use of this medicine in children. Special care may be needed. Overdosage: If you think you have taken too much of this medicine contact a poison control center or emergency room at once. NOTE: This medicine is only for you. Do not share this medicine with others. What if I miss a dose? This does not apply. What may interact with this medicine? Do not take this medicine with any of the following medications: -amprenavir -bosentan -fosamprenavir This medicine may also interact with the following medications: -barbiturate medicines for inducing sleep or treating seizures -certain medicines for fungal infections like ketoconazole and itraconazole -grapefruit juice -griseofulvin -medicines to treat seizures like carbamazepine, felbamate, oxcarbazepine,  phenytoin, topiramate -modafinil -phenylbutazone -rifampin -rufinamide -some medicines to treat HIV infection like atazanavir, indinavir, lopinavir, nelfinavir, tipranavir, ritonavir -St. Zeynab Klett's wort This list may not describe all possible interactions. Give your health care provider a list of all the medicines, herbs, non-prescription drugs, or dietary supplements you use. Also tell them if you smoke, drink alcohol, or use illegal drugs. Some items may interact with your medicine. What should I watch for while using this medicine? This product does not protect you against HIV infection (AIDS) or other sexually transmitted diseases. You should be able to feel the implant by pressing your fingertips over the skin where it was inserted. Contact your doctor if you cannot feel the implant, and use a non-hormonal birth control method (such as condoms) until your doctor confirms that the implant is in place. If you feel that the implant may have broken or become bent while in your arm, contact your healthcare provider. What side effects may I notice from receiving this medicine? Side effects that you should report to your doctor or health care professional as soon as possible: -allergic reactions like skin rash, itching or hives, swelling of the face, lips, or tongue -breast lumps -changes in emotions or moods -depressed mood -heavy or prolonged menstrual bleeding -pain, irritation, swelling, or bruising at the insertion site -scar at site of insertion -signs of infection at the insertion site such as fever, and skin redness, pain or discharge -signs of pregnancy -signs and symptoms of a blood clot such as breathing problems; changes in vision; chest pain; severe, sudden headache; pain, swelling, warmth in the leg; trouble speaking; sudden numbness or weakness of the face, arm or leg -signs and symptoms of liver injury like dark yellow or brown urine; general ill feeling or flu-like symptoms;  light-colored   stools; loss of appetite; nausea; right upper belly pain; unusually weak or tired; yellowing of the eyes or skin -unusual vaginal bleeding, discharge -signs and symptoms of a stroke like changes in vision; confusion; trouble speaking or understanding; severe headaches; sudden numbness or weakness of the face, arm or leg; trouble walking; dizziness; loss of balance or coordination Side effects that usually do not require medical attention (report to your doctor or health care professional if they continue or are bothersome): -acne -back pain -breast pain -changes in weight -dizziness -general ill feeling or flu-like symptoms -headache -irregular menstrual bleeding -nausea -sore throat -vaginal irritation or inflammation This list may not describe all possible side effects. Call your doctor for medical advice about side effects. You may report side effects to FDA at 1-800-FDA-1088. Where should I keep my medicine? This drug is given in a hospital or clinic and will not be stored at home. NOTE: This sheet is a summary. It may not cover all possible information. If you have questions about this medicine, talk to your doctor, pharmacist, or health care provider.  2018 Elsevier/Gold Standard (2015-11-14 11:19:22)  

## 2016-09-13 NOTE — Discharge Instructions (Signed)
Postpartum Hypertension  Postpartum hypertension is high blood pressure after pregnancy that remains higher than normal for more than two days after delivery. You may not realize that you have postpartum hypertension if your blood pressure is not being checked regularly. In some cases, postpartum hypertension will go away on its own, usually within a week of delivery. However, for some women, medical treatment is required to prevent serious complications, such as seizures or stroke.  The following things can affect your blood pressure:  · The type of delivery you had.  · Having received IV fluids or other medicines during or after delivery.    What are the causes?  Postpartum hypertension may be caused by any of the following or by a combination of any of the following:  · Hypertension that existed before pregnancy (chronic hypertension).  · Gestational hypertension.  · Preeclampsia or eclampsia.  · Receiving a lot of fluid through an IV during or after delivery.  · Medicines.  · HELLP syndrome.  · Hyperthyroidism.  · Stroke.  · Other rare neurological or blood disorders.    In some cases, the cause may not be known.  What increases the risk?  Postpartum hypertension can be related to one or more risk factors, such as:  · Chronic hypertension. In some cases, this may not have been diagnosed before pregnancy.  · Obesity.  · Type 2 diabetes.  · Kidney disease.  · Family history of preeclampsia.  · Other medical conditions that cause hormonal imbalances.    What are the signs or symptoms?  As with all types of hypertension, postpartum hypertension may not have any symptoms. Depending on how high your blood pressure is, you may experience:  · Headaches. These may be mild, moderate, or severe. They may also be steady, constant, or sudden in onset (thunderclap headache).  · Visual changes.  · Dizziness.  · Shortness of breath.  · Swelling of your hands, feet, lower legs, or face. In some cases, you may have swelling in  more than one of these locations.  · Heart palpitations or a racing heartbeat.  · Difficulty breathing while lying down.  · Decreased urination.    Other rare signs and symptoms may include:  · Sweating more than usual. This lasts longer than a few days after delivery.  · Chest pain.  · Sudden dizziness when you get up from sitting or lying down.  · Seizures.  · Nausea or vomiting.  · Abdominal pain.    How is this diagnosed?  The diagnosis of postpartum hypertension is made through a combination of physical examination findings and testing of your blood and urine. You may also have additional tests, such as a CT scan or an MRI, to check for other complications of postpartum hypertension.  How is this treated?  When blood pressure is high enough to require treatment, your options may include:  · Medicines to reduce blood pressure (antihypertensives). Tell your health care provider if you are breastfeeding or if you plan to breastfeed. There are many antihypertensive medicines that are safe to take while breastfeeding.  · Stopping medicines that may be causing hypertension.  · Treating medical conditions that are causing hypertension.  · Treating the complications of hypertension, such as seizures, stroke, or kidney problems.    Your health care provider will also continue to monitor your blood pressure closely and repeatedly until it is within a safe range for you.  Follow these instructions at home:  · Take medicines only   as directed by your health care provider.  · Get regular exercise after your health care provider tells you that it is safe.  · Follow your health care provider’s recommendations on fluid and salt restrictions.  · Do not use any tobacco products, including cigarettes, chewing tobacco, or electronic cigarettes. If you need help quitting, ask your health care provider.  · Keep all follow-up visits as directed by your health care provider. This is important.  Contact a health care provider  if:  · Your symptoms get worse.  · You have new symptoms, such as:  ? Headache.  ? Dizziness.  ? Visual changes.  Get help right away if:  · You develop a severe or sudden headache.  · You have seizures.  · You develop numbness or weakness on one side of your body.  · You have difficulty thinking, speaking, or swallowing.  · You develop severe abdominal pain.  · You develop difficulty breathing, chest pain, a racing heartbeat, or heart palpitations.  These symptoms may represent a serious problem that is an emergency. Do not wait to see if the symptoms will go away. Get medical help right away. Call your local emergency services (911 in the [place] Do not drive yourself to the hospital.  This information is not intended to replace advice given to you by your health care provider. Make sure you discuss any questions you have with your health care provider.  Document Released: 12/29/2013 Document Revised: 09/30/2015 Document Reviewed: 11/09/2013  Elsevier Interactive Patient Education © 2017 Elsevier Inc.

## 2016-09-13 NOTE — MAU Note (Addendum)
Pt had vaginal delivery on May 4, went home this morning, was treated for preeclampsia & had mag sulfate.  Pt states she was doing fine this morning, ate some grapes before discharge, started feeling slightly SOB.  SOB came some worse before leaving hospital, pt didn't inform staff.  Around 1250 she decided she couldn't breathe, called EMS.

## 2016-09-13 NOTE — Progress Notes (Signed)
Patient discharged to home with FOB And infant. Pre-eclampsia s/s reviewed with patient and patient verbalizes understanding.

## 2016-09-13 NOTE — Progress Notes (Signed)
  CLINICAL SOCIAL WORK MATERNAL/CHILD NOTE  Patient Details  Name: Grace Becker MRN: [id number] Date of Birth: 09/11/2016  Date:  09/13/2016  Clinical Social Worker Initiating Note:  Gilbert Narain, LCSW Date/ Time Initiated:  09/13/16/0915     Child's Name:  Grace Becker   Legal Guardian:  Other (Comment) (Parents: Grace Becker and Grace Becker)   Need for Interpreter:  None   Date of Referral:  09/13/16     Reason for Referral:  Late or No Prenatal Care    Referral Source:  Central Nursery   Address:  [address] [place], [place] [address]  Phone number:  [phone]   Household Members:  Minor Children, Parents, Significant Other (MOB reports that she lives with her mother, FOB and her 7 year old sister.)   Natural Supports (not living in the home):  Extended Family (She reports that her aunt and PGM are additional supports.)   Professional Supports: None   Employment:     Type of Work: 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  FOB is not working and will be staying home to care for the baby.   Education:      Financial Resources:  Medicaid   Other Resources:  WIC   Cultural/Religious Considerations Which May Impact Care: None stated.  Strengths:  Home prepared for child , Ability to meet basic needs  (Working with hospital staff to establish outpatient pediatric follow up.)   Risk Factors/Current Problems:  None   Cognitive State:  Able to Concentrate , Alert , Linear Thinking , Goal Oriented    Mood/Affect:  Calm , Comfortable , Interested , Euthymic    CSW Assessment: CSW met with MOB in her third floor room/302 to offer support and complete assessment due to limited PNC.  MOB had 3 visits in October 2017.  MOB was pleasant and receptive to CSW's visit.  She was holding baby in bed when CSW arrived.  FOB was asleep on the couch and MOB gave permission to speak with him in  the room. MOB reports that she is happy about becoming a mother.  She smiled when talking about her baby.  She states she and baby are doing well and ready to go home today.  She states she has a good support system and everything she needs for baby at home.  This is her first child and FOB's second.  He has a 1 year old son, with whom MOB reports he has regular contact.  MOB was aware of SIDS precautions as reviewed by CSW.  CSW provided education regarding PMADs and encouraged MOB to contact a medical professional if she has concerns about her emotions at any time.  MOB denies hx of mental illness or current concerns.   CSW inquired about PNC.  MOB reports that it was difficult to get to appointments because she could not miss work.  She reports that transportation is not a barrier and that she will make sure to get baby to her follow up appointments.  She was understanding of hospital drug screen policy and states no concerns.  CSW will monitor results.  CSW Plan/Description:  No Further Intervention Required/No Barriers to Discharge, Patient/Family Education     Yuan Gann Elizabeth, LCSW 09/13/2016, 10:43 AM  

## 2016-09-13 NOTE — Discharge Summary (Signed)
OB Discharge Summary     Patient Name: Grace Becker DOB: [birth year] MRN: [id number]  Date of admission: 09/10/2016 Delivering MD: Tilda Burrow   Date of discharge: 09/13/2016  Admitting diagnosis: [redacted]w[redacted]d, Contractions  Intrauterine pregnancy: [redacted]w[redacted]d     Secondary diagnosis:  Active Problems:   PROM (premature rupture of membranes) prolonged rupture of membranes  Additional problems: prolonged second stage.    Leukocytosis Discharge diagnosis: Term Pregnancy Delivered                                                                                                Post partum procedures:  Augmentation: Pitocin  Complications: None other than persistent  Leukocytosis  , gradually improving.   Hospital course:   Labor With Vaginal Delivery   21 y.o. yo G1P1001 at [redacted]w[redacted]d was admitted to the hospital 09/10/2016 for labor.Grace Becker is a 21 y.o. female presenting for contractions for 2 hours. While here she ruptured her membranes.  Cervix was 1cm yesterday. States UCs are very painful  I: Patient had an complicated labor course as follows: a prolonged second stage , x several hours , after which pt finally agreed to VAVD, and  Membrane Rupture Time/Date: 11:40 PM ,09/10/2016   Intrapartum Procedures: Episiotomy: None [1]                                         Lacerations:  1st degree [2]  Patient had delivery of a Viable infant.  Information for the patient's newborn:  Intisar, Claudio [[id number]]  Delivery Method: Vaginal, Vacuum (Extractor) (Filed from Delivery Summary)   09/11/2016  Details of delivery can be found in separate delivery note. Delivery Note The patient resumed pushing at 8:45 pm, with slow gradual progress as her pushing became more focused. Multiple positions attempted, and with hips flexed she made progress to +4 position, and requested vacuum assistance as progress slowed. At 10:33 PM a viable and healthyfemale was delivered via Vaginal, Kiwi vacuum  assisted vaginal delivery(Presentation: ; OA). APGAR: 8, 9; weight . pending Placenta status: , . Cord: with the following complications: . Cord pH: not done  Anesthesia: Epidural, local for left labia majora repair. Several 1st degree tears, subclitoral in midline, not bleeding and not warraning repair. Episiotomy: None Lacerations:  Suture Repair: vicryl 4-0 on SH Est. Blood Loss (mL):   Mom to postpartum. Baby to Couplet care / Skin to Skin.  Minard Millirons V 09/11/2016, 10:52 PM   Patient had a routine postpartum course.  And vulvar swelling resolved x 48 hrs. leukocystosis noted and persistent, declining graduallyPatient is discharged home 09/13/16, bottle feeeding and aware of need for bc= Nexplanon  Physical exam  Vitals:   09/12/16 2200 09/12/16 2300 09/13/16 0000 09/13/16 0330  BP:   128/89 136/90  Pulse:   88 74  Resp: 18 16 16 20   Temp:   97.4 F (36.3 C) 98.3 F (36.8 C)  TempSrc:   Oral Oral  SpO2: 99% 100% 100% 100%  Weight:      Height:       General: alert, cooperative and no distress Lochia: appropriate Uterine Fundus: firm Incision: No significant erythema DVT Evaluation: No evidence of DVT seen on physical exam. Labs: Lab Results  Component Value Date   WBC 28.1 (H) 09/13/2016   HGB 8.3 (L) 09/13/2016   HCT 25.3 (L) 09/13/2016   MCV 75.7 (L) 09/13/2016   PLT 221 09/13/2016   CMP Latest Ref Rng & Units 09/11/2016  Glucose 65 - 99 mg/dL 97  BUN 6 - 20 mg/dL <1(O<5(L)  Creatinine 1.090.44 - 1.00 mg/dL 6.040.60  Sodium 540135 - 981145 mmol/L 132(L)  Potassium 3.5 - 5.1 mmol/L 3.4(L)  Chloride 101 - 111 mmol/L 102  CO2 22 - 32 mmol/L 20(L)  Calcium 8.9 - 10.3 mg/dL 1.9(J8.4(L)  Total Protein 6.5 - 8.1 g/dL 6.8  Total Bilirubin 0.3 - 1.2 mg/dL 0.8  Alkaline Phos 38 - 126 U/L 133(H)  AST 15 - 41 U/L 22  ALT 14 - 54 U/L 10(L)    Discharge instruction: per After Visit Summary and "Baby and Me Booklet".  After visit meds:  Allergies as of 09/13/2016   No  Known Allergies     Medication List    STOP taking these medications   prenatal multivitamin Tabs tablet   terconazole 0.8 % vaginal cream Commonly known as:  TERAZOL 3     TAKE these medications   calcium carbonate 500 MG chewable tablet Commonly known as:  TUMS - dosed in mg elemental calcium Chew 1 tablet by mouth daily as needed for indigestion or heartburn.   docusate sodium 100 MG capsule Commonly known as:  COLACE Take 1 capsule (100 mg total) by mouth 2 (two) times daily.   ibuprofen 600 MG tablet Commonly known as:  ADVIL,MOTRIN Take 1 tablet (600 mg total) by mouth every 6 (six) hours.       Diet: routine diet  Activity: Advance as tolerated. Pelvic rest for 6 weeks.   Outpatient follow up:2 weeks Follow up Appt:No future appointments. health dept Follow up Visit:No Follow-up on file.  Postpartum contraception: Nexplanon  Newborn Data: Live born female  Birth Weight: 7 lb 10.8 oz (3481 g) APGAR: 8, 9  Baby Feeding: Bottle Disposition:home with mother   09/13/2016 Tilda BurrowFERGUSON,Shaquia Berkley V, MD

## 2016-09-13 NOTE — Progress Notes (Signed)
Post Partum Day 2 s/p prolonged second stage, vacuum assisted vaginal delivery, with marked vulvar edema and several 1st degree lacerations Subjective: no complaints, up ad lib, voiding, tolerating PO and vulva dramatically better, no pt c/o  Objective: Blood pressure 136/90, pulse 74, temperature 98.3 F (36.8 C), temperature source Oral, resp. rate 20, height 5\' 2"  (1.575 m), weight 66.2 kg (146 lb), last menstrual period 12/03/2015, SpO2 100 %, unknown if currently breastfeeding.  Physical Exam:  General: alert, cooperative and appears stated age Lochia: appropriate Uterine Fundus: firm at u-0 Incision:  DVT Evaluation: No evidence of DVT seen on physical exam.   Recent Labs  09/11/16 1732 09/12/16 0002  HGB 9.2* 8.9*  HCT 28.4* 26.8*   CBC Latest Ref Rng & Units 09/12/2016 09/11/2016 09/11/2016  WBC 4.0 - 10.5 K/uL 31.4(H) 21.6(H) 14.1(H)  Hemoglobin 12.0 - 15.0 g/dL 1.6(X8.9(L) 0.9(U9.2(L) 0.4(V9.1(L)  Hematocrit 36.0 - 46.0 % 26.8(L) 28.4(L) 27.5(L)  Platelets 150 - 400 K/uL 194 220 217   CBC    Component Value Date/Time   WBC 28.1 (H) 09/13/2016 0517   RBC 3.34 (L) 09/13/2016 0517   HGB 8.3 (L) 09/13/2016 0517   HCT 25.3 (L) 09/13/2016 0517   PLT 221 09/13/2016 0517   MCV 75.7 (L) 09/13/2016 0517   MCH 24.9 (L) 09/13/2016 0517   MCHC 32.8 09/13/2016 0517   RDW 15.7 (H) 09/13/2016 0517   LYMPHSABS 3.7 09/13/2016 0517   MONOABS 0.8 09/13/2016 0517   EOSABS 0.0 09/13/2016 0517   BASOSABS 0.0 09/13/2016 0517      Assessment/Plan: Resolving leukocytosis, no sign of infection Discharge home, Breastfeeding and Contraception .__Nexplanon_____   LOS: 2 days   Dellene Mcgroarty V 09/13/2016, 5:02 AM

## 2016-09-15 ENCOUNTER — Inpatient Hospital Stay (HOSPITAL_COMMUNITY): Admission: RE | Admit: 2016-09-15 | Payer: Medicaid Other | Source: Ambulatory Visit

## 2016-09-17 ENCOUNTER — Ambulatory Visit: Payer: Self-pay

## 2016-10-17 ENCOUNTER — Emergency Department (HOSPITAL_COMMUNITY)
Admission: EM | Admit: 2016-10-17 | Discharge: 2016-10-17 | Disposition: A | Discharge status: Home / Self Care | Payer: Medicaid Other | Attending: Emergency Medicine | Admitting: Emergency Medicine

## 2016-10-17 DIAGNOSIS — R42 Dizziness and giddiness: Secondary | ICD-10-CM | POA: Diagnosis not present

## 2016-10-17 DIAGNOSIS — Z79899 Other long term (current) drug therapy: Secondary | ICD-10-CM | POA: Diagnosis not present

## 2016-10-17 DIAGNOSIS — I10 Essential (primary) hypertension: Secondary | ICD-10-CM

## 2016-10-17 NOTE — Discharge Instructions (Signed)
Continue taking your blood pressure medication as prescribed. I recommend checking your blood pressure once daily for the next week when you restart your medication.  Follow up with your OBGYN at your scheduled appointment on June 21st for follow up evaluation regarding your blood pressure. Please return to the Emergency Department if symptoms worsen or new onset of fever, headache, dizziness, visual changes, chest pain, difficulty breathing, abdominal pain, vomiting, numbness, weakness, syncope.

## 2016-10-17 NOTE — ED Triage Notes (Signed)
Pt reports episode of dizziness. Pt denies n/v or diarrhea. Pt denies pain. Pt A+OX4, speaking in complete sentences, ambulatory to triage.

## 2016-10-17 NOTE — ED Provider Notes (Signed)
[facility]-EMERGENCY DEPT Provider Note   CSN: [id number] Arrival date & time: 10/17/16  0135     History   Chief Complaint Chief Complaint  Patient presents with  . Dizziness    HPI MELESSIA KAUS is a 21 y.o. female.  HPI   Patient is a 21 year old female with history of pregnancy-induced hypertension who presents the ED with complaint of intermittent lightheadedness, onset this evening. Patient reports around midnight while she was shopping in the grocery store with her mother she began to feel lightheaded which she states lasted approximately 2-3 hours and then resolved continuously. She reports since arrival to the ED she has not had any symptoms. Patient states she attributes her symptoms to eating a pepperoni pizza earlier today. She states she has been trying to eat well and thinks eating the pork today, cervical pressure to elevate. She notes she had a vaginal delivery on May 4th without any complications but notes she was started on BP meds appx. 1 week after delivery by her OB. Patient states she took her blood pressure medications for one week but since then has not been taking them for the past month due to forgetting. She currently denies any pain or complaints. Denies fever, headache, visual changes, cough, shortness of breath, chest pain, palpitations, abdominal pain, nausea, vomiting, diarrhea, urinary symptoms, vaginal bleeding, numbness, tingling, weakness, syncope.  Past Medical History:  Diagnosis Date  . Pregnancy induced hypertension     Patient Active Problem List   Diagnosis Date Noted  . PROM (premature rupture of membranes) 09/11/2016    Past Surgical History:  Procedure Laterality Date  . NO PAST SURGERIES      OB History    Gravida Para Term Preterm AB Living   1 1 1     1    SAB TAB Ectopic Multiple Live Births         0 1       Home Medications    Prior to Admission medications   Medication Sig Start Date End Date Taking? Authorizing Provider   amLODipine (NORVASC) 5 MG tablet Take 1 tablet (5 mg total) by mouth daily. 09/13/16 09/13/17  Marylene Land, CNM  docusate sodium (COLACE) 100 MG capsule Take 1 capsule (100 mg total) by mouth 2 (two) times daily. Patient not taking: Reported on 09/13/2016 09/13/16   Tilda Burrow, MD  triamterene-hydrochlorothiazide (MAXZIDE-25) 37.5-25 MG tablet Take 1 tablet by mouth daily. 09/13/16   Marylene Land, CNM    Family History Family History  Problem Relation Age of Onset  . Hypertension Other     Social History Social History  Substance Use Topics  . Smoking status: Never Smoker  . Smokeless tobacco: Never Used  . Alcohol use No     Allergies   Patient has no known allergies.   Review of Systems Review of Systems  Neurological: Positive for light-headedness.  All other systems reviewed and are negative.    Physical Exam Updated Vital Signs BP (!) 137/109 (BP Location: Left Arm)   Pulse 87   Temp 97.7 F (36.5 C) (Oral)   Resp 18   LMP  (LMP Unknown)   SpO2 100%   Physical Exam  Constitutional: She is oriented to person, place, and time. She appears well-developed and well-nourished. No distress.  HENT:  Head: Normocephalic and atraumatic.  Right Ear: Tympanic membrane normal.  Left Ear: Tympanic membrane normal.  Nose: Nose normal. Right sinus exhibits no maxillary sinus tenderness and no frontal  sinus tenderness. Left sinus exhibits no maxillary sinus tenderness and no frontal sinus tenderness.  Mouth/Throat: Uvula is midline, oropharynx is clear and moist and mucous membranes are normal. No oropharyngeal exudate, posterior oropharyngeal edema, posterior oropharyngeal erythema or tonsillar abscesses.  Eyes: Conjunctivae and EOM are normal. Pupils are equal, round, and reactive to light. Right eye exhibits no discharge. Left eye exhibits no discharge. No scleral icterus.  Neck: Normal range of motion. Neck supple.  Cardiovascular: Normal rate,  regular rhythm, normal heart sounds and intact distal pulses.   Pulmonary/Chest: Effort normal and breath sounds normal. No respiratory distress. She has no wheezes. She has no rales. She exhibits no tenderness.  Abdominal: Soft. Bowel sounds are normal. She exhibits no distension and no mass. There is no tenderness. There is no rebound and no guarding.  Musculoskeletal: Normal range of motion. She exhibits no edema.  No midline C, T, or L tenderness. Full range of motion of neck and back. Full range of motion of bilateral upper and lower extremities, with 5/5 strength. Sensation intact. 2+ radial and PT pulses. Cap refill <2 seconds. Patient able to stand and ambulate without assistance, no ataxia noted.    Lymphadenopathy:    She has no cervical adenopathy.  Neurological: She is alert and oriented to person, place, and time. She has normal strength. No cranial nerve deficit or sensory deficit. She displays a negative Romberg sign. Coordination and gait normal.  Skin: Skin is warm and dry. She is not diaphoretic.  Nursing note and vitals reviewed.    ED Treatments / Results  Labs (all labs ordered are listed, but only abnormal results are displayed) Labs Reviewed - No data to display  EKG  EKG Interpretation  Date/Time:  Saturday October 17 2016 04:10:13 EDT Ventricular Rate:  75 PR Interval:    QRS Duration: 94 QT Interval:  398 QTC Calculation: 445 R Axis:   52 Text Interpretation:  Sinus arrhythmia Otherwise within normal limits When compared with ECG of 09/13/2016, No significant change was found Confirmed by Dione BoozeGlick, David ([id number]) on 10/17/2016 4:24:15 AM       Radiology No results found.  Procedures Procedures (including critical care time)  Medications Ordered in ED Medications - No data to display   Initial Impression / Assessment and Plan / ED Course  I have reviewed the triage vital signs and the nursing notes.  Pertinent labs & imaging results that were available  during my care of the patient were reviewed by me and considered in my medical decision making (see chart for details).     Patient presents with episode of lightheadedness that occurred earlier this evening while she was walking in a grocery store. Symptoms have resolved since arrival to the ED. Patient reviewed her symptoms to her elevated blood pressure and reports she has not been taking her blood pressure medication for the past 3-4 weeks. History of pregnancy-induced hypertension. Patient reports having vaginal delivery on May 4 without any complications. VSS. Exam unremarkable. No neuro deficits. Patient denies any pain or complaints. Orthostatics negative. EKG showed sinus arrhythmia with no acute ischemic changes, no significant changes from prior. Patient has remained asymptomatic since arriving to the ED. I had a long discussion with patient regarding medication compliance and I also recommended checking her blood pressure daily for the next week while she restarts her medication. Advised patient to follow up with her OB at her scheduled appointment on June 21. Discussed return precautions.  Final Clinical Impressions(s) / ED Diagnoses  Final diagnoses:  Lightheadedness  Hypertension, unspecified type    New Prescriptions New Prescriptions   No medications on file     Barrett Henle, Cordelia Poche 10/17/16 0454    Dione Booze, MD 10/17/16 [phone]

## 2016-11-02 ENCOUNTER — Emergency Department (HOSPITAL_COMMUNITY)
Admission: EM | Admit: 2016-11-02 | Discharge: 2016-11-03 | Disposition: A | Discharge status: Home / Self Care | Payer: Medicaid Other | Attending: Emergency Medicine | Admitting: Emergency Medicine

## 2016-11-02 ENCOUNTER — Emergency Department (HOSPITAL_COMMUNITY): Payer: Medicaid Other

## 2016-11-02 DIAGNOSIS — R0602 Shortness of breath: Secondary | ICD-10-CM

## 2016-11-02 DIAGNOSIS — O9089 Other complications of the puerperium, not elsewhere classified: Secondary | ICD-10-CM | POA: Diagnosis not present

## 2016-11-02 DIAGNOSIS — R072 Precordial pain: Secondary | ICD-10-CM | POA: Diagnosis not present

## 2016-11-02 MED ORDER — SODIUM CHLORIDE 0.9 % IV BOLUS (SEPSIS)
1000.0000 mL | Freq: Once | INTRAVENOUS | Status: AC
  Administered 2016-11-03: 1000 mL via INTRAVENOUS

## 2016-11-02 NOTE — ED Provider Notes (Signed)
[facility] Provider Note   CSN: [id number] Arrival date & time: 11/02/16  2105     History   Chief Complaint Chief Complaint  Patient presents with  . Chest Pain    HPI Grace Becker is a 21 y.o. female with past medical history of pregnancy-induced hypertension who presents today with chief complaint gradual onset intermittent shortness of breath with associated difficulty swallowing for the past several days. Patient gave birth vaginally May 5th and states she has been hypertensive since. She was seen and evaluated at [facility] ED for complaint of lightheadedness, and at that time was not taking her blood pressure medications as prescribed. She states that she has begun taking her amlodipine 5 mg daily as prescribed, but has not been taking her triamterene-HCTZ because she does not have it. Since this time she states that she has been experiencing episodes that are a few seconds in length of shortness of breath with associated difficulty swallowing and she denies lightheadedness, diaphoresis, chest pain, chest tightness, headaches, syncope, or nausea. She denies difficulty eating or drinking, swelling of the lips or tongue, or drooling. She states episodes seem to occur at night, and she also endorses insomnia with this, sleeping only for short periods of time. She is a symptomatically at this time. She denies recent travel or surgeries, hemoptysis, prior history of DVT or PE. She is on Depo-Provera, which she began 4 days ago. She is not currently breast-feeding.  The history is provided by the patient.    Past Medical History:  Diagnosis Date  . Pregnancy induced hypertension     Patient Active Problem List   Diagnosis Date Noted  . PROM (premature rupture of membranes) 09/11/2016    Past Surgical History:  Procedure Laterality Date  . NO PAST SURGERIES      OB History    Gravida Para Term Preterm AB Living   1 1 1     1    SAB TAB Ectopic Multiple Live Births          0 1       Home Medications    Prior to Admission medications   Medication Sig Start Date End Date Taking? Authorizing Provider  amLODipine (NORVASC) 5 MG tablet Take 1 tablet (5 mg total) by mouth daily. 09/13/16 09/13/17 Yes Marylene LandKooistra, Kathryn Lorraine, CNM  docusate sodium (COLACE) 100 MG capsule Take 1 capsule (100 mg total) by mouth 2 (two) times daily. Patient not taking: Reported on 09/13/2016 09/13/16   Tilda BurrowFerguson, John V, MD  triamterene-hydrochlorothiazide (MAXZIDE-25) 37.5-25 MG tablet Take 1 tablet by mouth daily. Patient not taking: Reported on 11/02/2016 09/13/16   Marylene LandKooistra, Kathryn Lorraine, CNM    Family History Family History  Problem Relation Age of Onset  . Hypertension Other     Social History Social History  Substance Use Topics  . Smoking status: Never Smoker  . Smokeless tobacco: Never Used  . Alcohol use No     Allergies   Patient has no known allergies.   Review of Systems Review of Systems  Constitutional: Negative for chills and fever.  HENT: Positive for trouble swallowing.   Respiratory: Positive for shortness of breath. Negative for cough and chest tightness.   Cardiovascular: Negative for chest pain.  Gastrointestinal: Negative for abdominal pain, diarrhea, nausea and vomiting.  Musculoskeletal: Negative for back pain and neck pain.  Neurological: Negative for light-headedness.  Psychiatric/Behavioral: Positive for sleep disturbance.  All other systems reviewed and are negative.    Physical  Exam Updated Vital Signs BP (!) 123/94   Pulse (!) 110   Temp 99.1 F (37.3 C) (Oral)   Resp (!) 21   Ht 5' (1.524 m)   Wt 49.4 kg (109 lb)   LMP  (LMP Unknown)   SpO2 99%   BMI 21.29 kg/m   Physical Exam  Constitutional: She appears well-developed and well-nourished.  HENT:  Head: Normocephalic and atraumatic.  Eyes: Conjunctivae are normal. Right eye exhibits no discharge. Left eye exhibits no discharge.  Neck: No JVD present. No  tracheal deviation present.  Cardiovascular: Regular rhythm and normal heart sounds.   Tachycardic, 2+ radial and DP/PT pulses bl, negative Homan's bl   Pulmonary/Chest: Effort normal and breath sounds normal. No respiratory distress. She exhibits no tenderness.  Abdominal: Soft. She exhibits no distension. There is no tenderness.  Musculoskeletal: Normal range of motion. She exhibits no edema.  Neurological: She is alert. No cranial nerve deficit or sensory deficit.  Skin: Skin is warm and dry. Capillary refill takes less than 2 seconds.  Psychiatric: She has a normal mood and affect. Her behavior is normal.     ED Treatments / Results  Labs (all labs ordered are listed, but only abnormal results are displayed) Labs Reviewed  D-DIMER, QUANTITATIVE (NOT AT [facility])  BRAIN NATRIURETIC PEPTIDE  TSH  CBC WITH DIFFERENTIAL/PLATELET  BASIC METABOLIC PANEL  I-STAT TROPOININ, ED    EKG  EKG Interpretation  Date/Time:  Monday November 02 2016 21:11:01 EDT Ventricular Rate:  113 PR Interval:    QRS Duration: 74 QT Interval:  368 QTC Calculation: 505 R Axis:   58 Text Interpretation:  Atrial flutter with predominant 3:1 AV block Nonspecific T abnormalities, anterior leads Prolonged QT interval T wave inversion V3 and tachycardia new from previous prolonged QT new from previous Confirmed by Frederick Peers [phone]) on 11/02/2016 10:32:21 PM       Radiology Dg Chest 2 View  Result Date: 11/02/2016 CLINICAL DATA:  Dyspnea.  Difficulty swallowing. EXAM: CHEST  2 VIEW COMPARISON:  None. FINDINGS: The lungs are clear. The pulmonary vasculature is normal. Heart size is normal. Hilar and mediastinal contours are unremarkable. There is no pleural effusion. IMPRESSION: No active cardiopulmonary disease. Electronically Signed   By: Ellery Plunk M.D.   On: 11/02/2016 23:16    Procedures Procedures (including critical care time)  Medications Ordered in ED Medications  sodium chloride 0.9 %  bolus 1,000 mL (1,000 mLs Intravenous New Bag/Given 11/03/16 0024)     Initial Impression / Assessment and Plan / ED Course  I have reviewed the triage vital signs and the nursing notes.  Pertinent labs & imaging results that were available during my care of the patient were reviewed by me and considered in my medical decision making (see chart for details).    Patient who is >1 month postpartum with pregnancy-induced hypertension presents with complaint of shortness of breath and difficulty swallowing intermittently for several days. Afebrile, consistently mildy hypertensive and tachycardic while in the ED. Patient well-appearing and in no apparent distress while in the ED. EKG shows new changes including tachycardia, prolonged QT interval and T-wave inversion in V3 Concern for PE versus post partum cardiomyopathy versus postpartum thyroiditis.  1:41 AM Still awaiting lab results. Signed out to Dr. Dawna Part; if lab work is negative for DVT or other concerning etiology of symptoms, patient is stable for discharge home with follow-up with her primary care for possible medication management for her hypertension. Final Clinical Impressions(s) / ED  Diagnoses   Final diagnoses:  Shortness of breath    New Prescriptions New Prescriptions   No medications on file     Bennye Alm 11/03/16 0146    Azalia Bilis, MD 11/03/16 [phone]

## 2016-11-02 NOTE — ED Triage Notes (Addendum)
Pt to ED via [facility] with c/o feeling like she could not get a deep breath.  And feeling like she could not swallow.  Pt denies any chest pain and no resp distress present at this time.  Pt is 1 month post partum and has had HTN since before the birth.  Pt is currently taking meds for same. Pt was [facility] for similar complaints

## 2016-11-03 LAB — CBC WITH DIFFERENTIAL/PLATELET
Basophils Absolute: 0 10*3/uL (ref 0.0–0.1)
Basophils Relative: 0 %
Eosinophils Absolute: 0 10*3/uL (ref 0.0–0.7)
Eosinophils Relative: 0 %
HCT: 41.7 % (ref 36.0–46.0)
Hemoglobin: 13.2 g/dL (ref 12.0–15.0)
Lymphocytes Relative: 41 %
Lymphs Abs: 2.7 10*3/uL (ref 0.7–4.0)
MCH: 24.3 pg — ABNORMAL LOW (ref 26.0–34.0)
MCHC: 31.7 g/dL (ref 30.0–36.0)
MCV: 76.8 fL — ABNORMAL LOW (ref 78.0–100.0)
Monocytes Absolute: 0.3 10*3/uL (ref 0.1–1.0)
Monocytes Relative: 5 %
Neutro Abs: 3.6 10*3/uL (ref 1.7–7.7)
Neutrophils Relative %: 54 %
Platelets: 295 10*3/uL (ref 150–400)
RBC: 5.43 MIL/uL — ABNORMAL HIGH (ref 3.87–5.11)
RDW: 17.9 % — ABNORMAL HIGH (ref 11.5–15.5)
WBC: 6.6 10*3/uL (ref 4.0–10.5)

## 2016-11-03 LAB — BASIC METABOLIC PANEL
Anion gap: 12 (ref 5–15)
BUN: 9 mg/dL (ref 6–20)
CO2: 21 mmol/L — ABNORMAL LOW (ref 22–32)
Calcium: 9.9 mg/dL (ref 8.9–10.3)
Chloride: 106 mmol/L (ref 101–111)
Creatinine, Ser: 0.69 mg/dL (ref 0.44–1.00)
GFR calc Af Amer: >60 mL/min (ref >60)
GFR calc non Af Amer: >60 mL/min (ref >60)
Glucose, Bld: 85 mg/dL (ref 65–99)
Potassium: 3.6 mmol/L (ref 3.5–5.1)
Sodium: 139 mmol/L (ref 135–145)

## 2016-11-03 LAB — D-DIMER, QUANTITATIVE: D-Dimer, Quant: 0.46 ug/mL-FEU (ref 0.00–0.50)

## 2016-11-03 LAB — BRAIN NATRIURETIC PEPTIDE: B Natriuretic Peptide: 2.7 pg/mL (ref 0.0–100.0)

## 2016-11-03 LAB — I-STAT TROPONIN, ED: Troponin i, poc: 0 ng/mL (ref 0.00–0.08)

## 2016-11-03 LAB — TSH: TSH: 1.725 u[IU]/mL (ref 0.350–4.500)

## 2017-01-27 ENCOUNTER — Ambulatory Visit (HOSPITAL_COMMUNITY)
Admission: EM | Admit: 2017-01-27 | Discharge: 2017-01-27 | Disposition: A | Discharge status: Home / Self Care | Payer: Self-pay | Attending: Family Medicine | Admitting: Family Medicine

## 2017-01-27 ENCOUNTER — Encounter (HOSPITAL_COMMUNITY): Payer: Self-pay | Admitting: Emergency Medicine

## 2017-01-27 DIAGNOSIS — R0989 Other specified symptoms and signs involving the circulatory and respiratory systems: Secondary | ICD-10-CM

## 2017-01-27 DIAGNOSIS — F458 Other somatoform disorders: Secondary | ICD-10-CM

## 2017-01-27 DIAGNOSIS — T148XXA Other injury of unspecified body region, initial encounter: Secondary | ICD-10-CM

## 2017-01-27 DIAGNOSIS — F411 Generalized anxiety disorder: Secondary | ICD-10-CM

## 2017-01-27 DIAGNOSIS — S29012A Strain of muscle and tendon of back wall of thorax, initial encounter: Secondary | ICD-10-CM

## 2017-01-27 HISTORY — DX: Anxiety disorder, unspecified: F41.9

## 2017-01-27 NOTE — ED Triage Notes (Signed)
The patient presented to the [facility] with a complaint of upper back pain when she breathes and tightness in her throat. The patient reported that she has anxiety and is supposed to start her medications tomorrow.

## 2017-01-27 NOTE — Discharge Instructions (Signed)
Start taking your anxiety medications tomorrow as directed. For back pain apply heat to the areas of muscle pain. Perform stretches of the involved muscles. Limit activities that would cause pain or injury to the muscles. Continued follow-up with your primary care provider as needed.

## 2017-01-27 NOTE — ED Provider Notes (Signed)
[facility]    CSN: [id number] Arrival date & time: 01/27/17  1637     History   Chief Complaint Chief Complaint  Patient presents with  . Back Pain    HPI Grace Becker is a 21 y.o. female.   21 year old female. Described anxiety disorder states she had a panic at [employer] this afternoon and afterwards she developed pain in her right parathoracic back. She said it hurts when she moves and coughs. She also had sensation that her throat was closing up in the still feels a little at way now. Upon entering the room she was laughing and talking giggling, on her phone. Showing absolutely no signs of distress.      Past Medical History:  Diagnosis Date  . Anxiety   . Pregnancy induced hypertension     Patient Active Problem List   Diagnosis Date Noted  . PROM (premature rupture of membranes) 09/11/2016    Past Surgical History:  Procedure Laterality Date  . NO PAST SURGERIES      OB History    Gravida Para Term Preterm AB Living   SAB TAB Ectopic Multiple Live Births         0 1       Home Medications    Prior to Admission medications   Medication Sig Start Date End Date Taking? Authorizing Provider  clonazePAM (KLONOPIN) 0.5 MG tablet Take 0.5 mg by mouth 2 (two) times daily as needed for anxiety.   Yes [provider]  escitalopram (LEXAPRO) 10 MG tablet Take 10 mg by mouth daily.   Yes [provider]    Family History Family History  Problem Relation Age of Onset  . Hypertension Other     Social History Social History  Substance Use Topics  . Smoking status: Never Smoker  . Smokeless tobacco: Never Used  . Alcohol use No     Allergies   Patient has no known allergies.   Review of Systems Review of Systems  Constitutional: Negative.  Negative for activity change, chills and fever.  HENT: Negative.        As per history of present illness  Respiratory: Negative.   Cardiovascular: Negative.     Musculoskeletal: Positive for back pain.       As per HPI  Skin: Negative for color change, pallor and rash.  Neurological: Negative.   All other systems reviewed and are negative.    Physical Exam Triage Vital Signs ED Triage Vitals  Enc Vitals Group     BP 01/27/17 1736 123/85     Pulse Rate 01/27/17 1736 89     Resp 01/27/17 1736 18     Temp 01/27/17 1736 98.9 F (37.2 C)     Temp Source 01/27/17 1736 Oral     SpO2 01/27/17 1736 100 %     Weight --      Height --      Head Circumference --      Peak Flow --      Pain Score 01/27/17 1734 8     Pain Loc --      Pain Edu? --      Excl. in GC? --    No data found.   Updated Vital Signs BP 123/85 (BP Location: Left Arm)   Pulse 89   Temp 98.9 F (37.2 C) (Oral)   Resp 18   SpO2 100%   Breastfeeding? No  Visual Acuity Right Eye Distance:   Left Eye Distance:   Bilateral Distance:    Right Eye Near:   Left Eye Near:    Bilateral Near:     Physical Exam  Constitutional: She is oriented to person, place, and time. She appears well-developed and well-nourished. No distress.  HENT:  Head: Normocephalic and atraumatic.  Unable to visualize oropharynx due to patient's untenable retraction of her tongue.  Eyes: EOM are normal.  Neck: Normal range of motion. Neck supple.  Cardiovascular: Normal rate, regular rhythm, normal heart sounds and intact distal pulses.  Exam reveals no gallop and no friction rub.   No murmur heard. Pulmonary/Chest: Effort normal and breath sounds normal. No respiratory distress. She has no wheezes. She has no rales.  Musculoskeletal: Normal range of motion. She exhibits tenderness. She exhibits no edema or deformity.  Tenderness to the points of pain touches the right parathoracic musculature. This reproduces the pain for which she presents. There is also tenderness to the bilateral upper outer quadrants of the bilateral anterior chest.  Neurological: She is alert and oriented to person,  place, and time. No cranial nerve deficit.  Skin: Skin is warm and dry.  Psychiatric: She has a normal mood and affect.  Nursing note and vitals reviewed.    UC Treatments / Results  Labs (all labs ordered are listed, but only abnormal results are displayed) Labs Reviewed - No data to display  EKG  EKG Interpretation None       Radiology No results found.  Procedures Procedures (including critical care time)  Medications Ordered in UC Medications - No data to display   Initial Impression / Assessment and Plan / UC Course  I have reviewed the triage vital signs and the nursing notes.  Pertinent labs & imaging results that were available during my care of the patient were reviewed by me and considered in my medical decision making (see chart for details).    Start taking your anxiety medications tomorrow as directed. For back pain apply heat to the areas of muscle pain. Perform stretches of the involved muscles. Limit activities that would cause pain or injury to the muscles. Continued follow-up with your primary care provider as needed.    Final Clinical Impressions(s) / UC Diagnoses   Final diagnoses:  Muscle strain  Generalized anxiety disorder  Globus pharyngeus    New Prescriptions New Prescriptions   No medications on file     Controlled Substance Prescriptions [place] Controlled Substance Registry consulted? Not Applicable   Hayden Rasmussen, NP 01/27/17 [phone]

## 2018-02-19 IMAGING — CR DG CHEST 2V
2 series · 2 of 2 positions shown · non-contrast
Comparison: None.

CLINICAL DATA: Dyspnea.  Difficulty swallowing.

EXAM:
CHEST  2 VIEW

[chest pa]
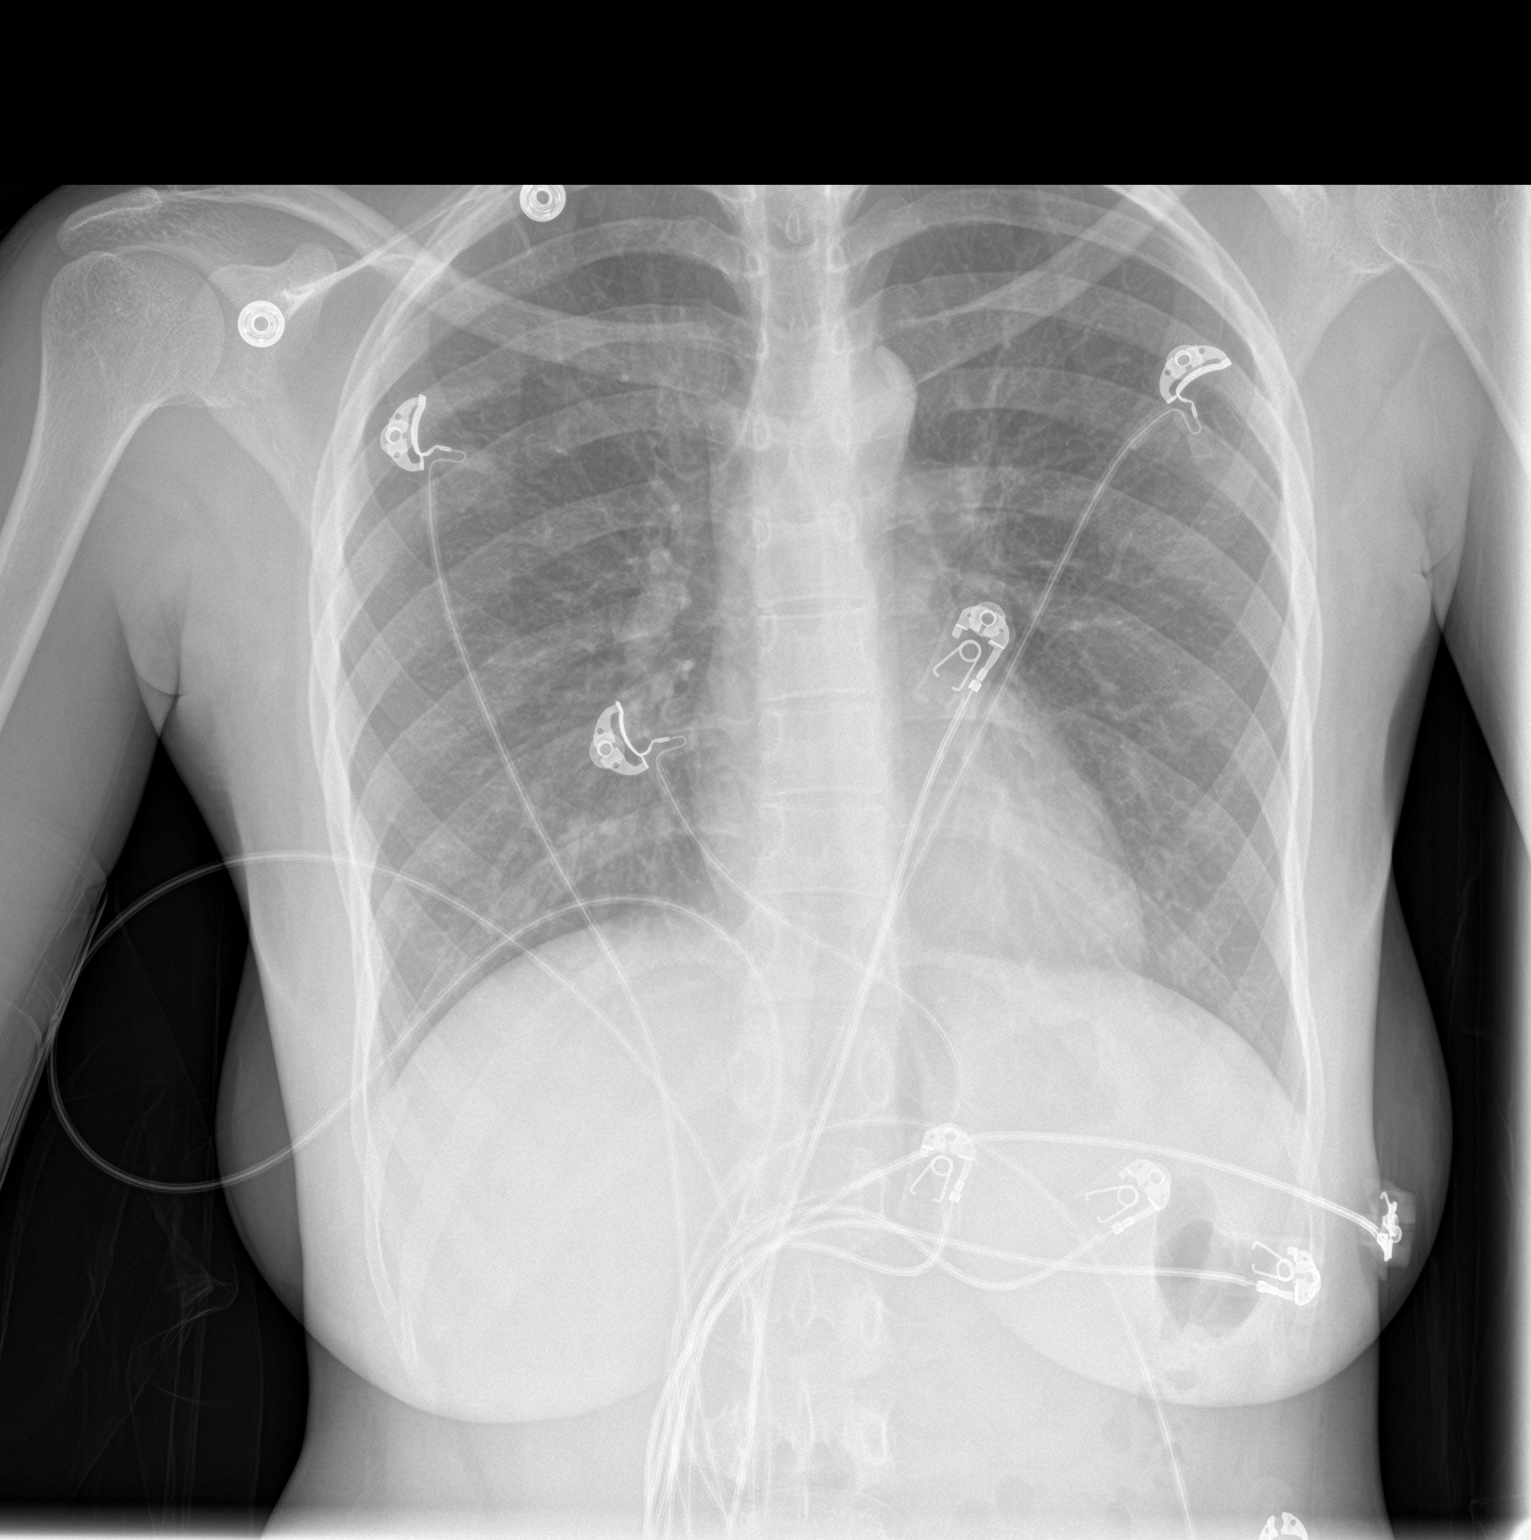

[chest lat]
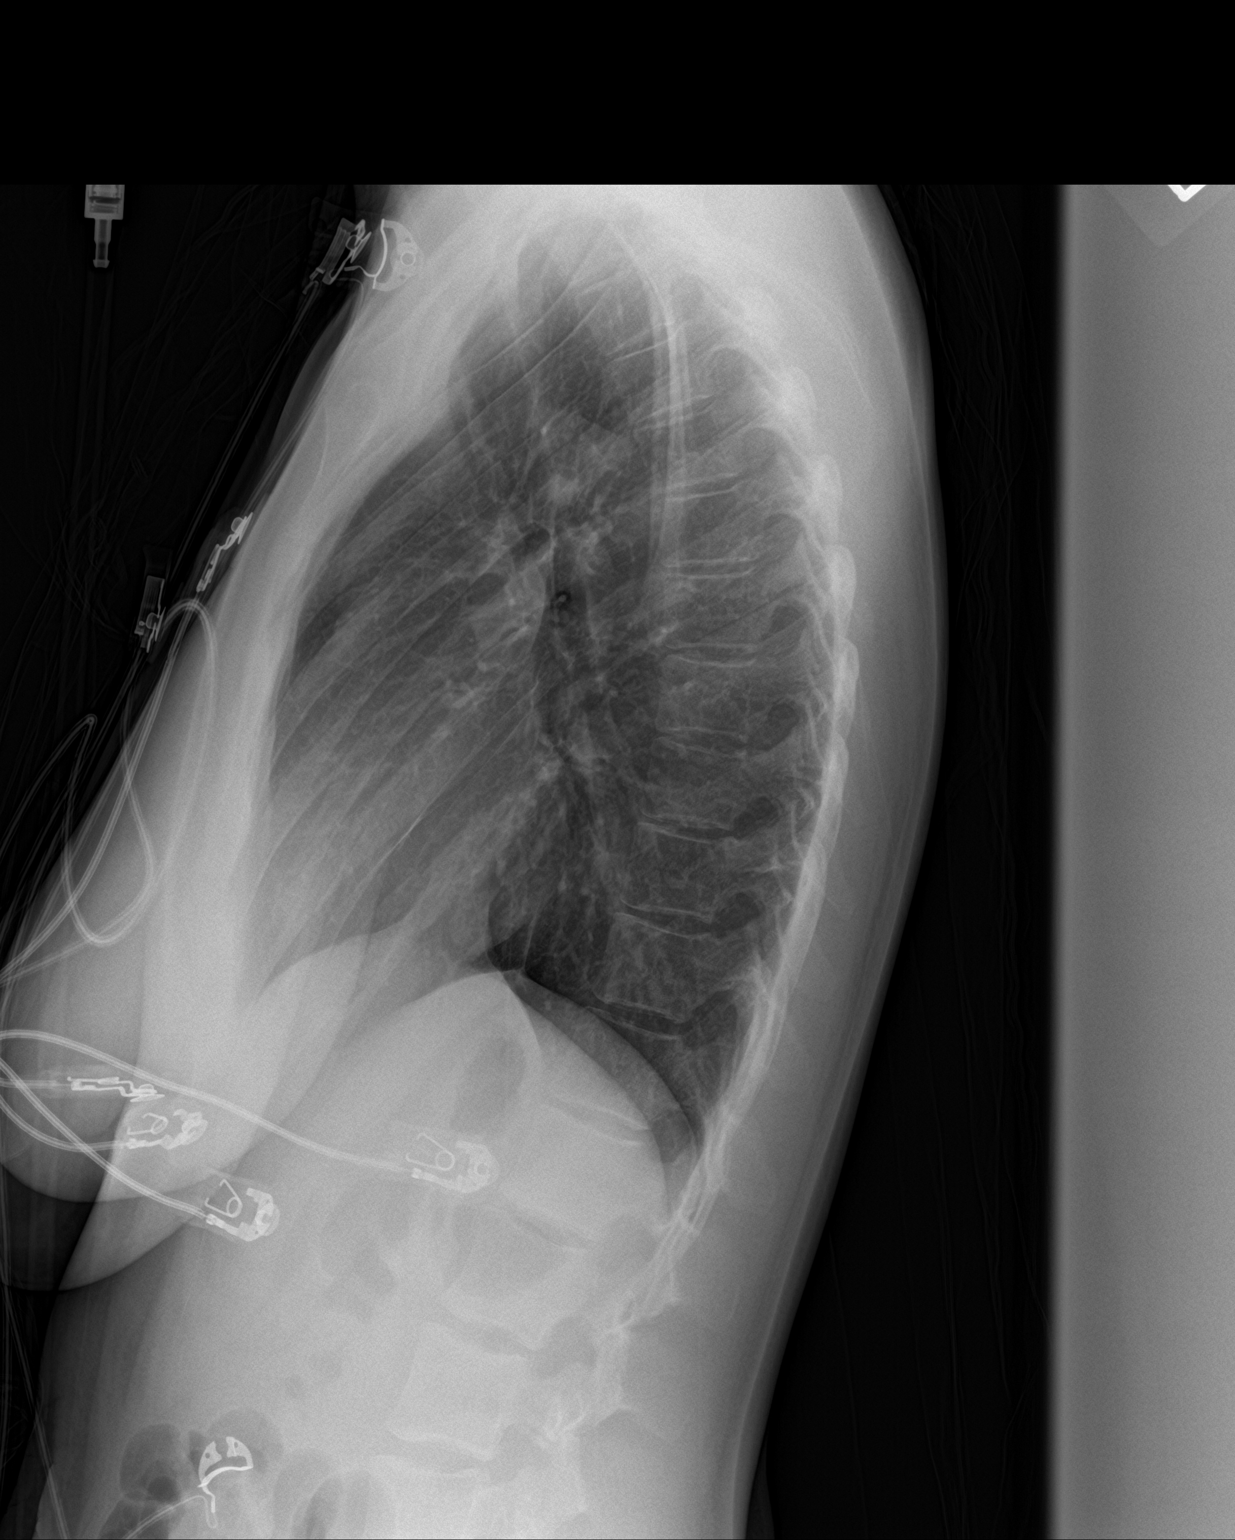

[2 of 2 positions shown; findings below may reference images not displayed]

FINDINGS: The lungs are clear. The pulmonary vasculature is normal. Heart size
is normal. Hilar and mediastinal contours are unremarkable. There is
no pleural effusion.
IMPRESSION: No active cardiopulmonary disease.

## 2019-05-12 NOTE — L&D Delivery Note (Signed)
Operative Delivery Note At 1:06 PM a viable female was delivered via Vaginal, Vacuum Investment banker, operational).  Presentation: vertex; Position: Right,, Occiput,, Anterior; Station: +3.   Verbal consent: obtained from patient.  Risks and benefits discussed in detail.  Risks include, but are not limited to the risks of anesthesia, bleeding, infection, damage to maternal tissues, fetal cephalhematoma.  There is also the risk of inability to effect vaginal delivery of the head, or shoulder dystocia that cannot be resolved by established maneuvers, leading to the need for emergency cesarean section.  Indication for operative vaginal delivery: Prolonged second stage of labor with maternal exhaustion and late decelerations with every contraction/pushing.  Patient was examined and found to be fully dilated with fetal station of +3. Bladder was drained with red rubber catheter. The soft vacuum kiwi cup was positioned over the sagittal suture 3 cm anterior to posterior fontanelle.  Pressure was then increased to 500 mmHg, and the patient was instructed to push.  Pulling was administered along the pelvic curve.  3 pulls were administered during 3 pushes with great maternal effort, 0 popoffs, during single contraction.  The infant was then delivered atraumatically, noted to be a viable female infant, Apgars of 9 and 9, weight was 7 pounds 10 ounces.  There was spontaneous placental delivery, intact with three-vessel cord.  Intact perineum. EBL , epidural anesthesia.   Sponge, instrument and needle counts were correct x2.  The patient and baby were stable after delivery and remained in couplet care. Neonatology present for delivery.   APGAR: 9, 9; weight 7 lb 10 oz (3459 g).   Placenta status: To L&D Cord:  with the following complications: NONE .  Cord pH: N/A  Anesthesia:  Epidural Instruments: Kiwi soft vacuum Episiotomy: None Lacerations: 2nd degree Suture Repair: 3.0 vicryl rapide and 2.0 Monocryl Est. Blood Loss  (mL):  Mom to postpartum.  Baby to Couplet care / Skin to Skin.   Dr. Donavan Foil was present for the entire procedure.  Jen Mow, DO 11/09/2019, 2:41 PM

## 2019-07-13 ENCOUNTER — Other Ambulatory Visit: Payer: Self-pay

## 2019-07-13 ENCOUNTER — Ambulatory Visit (INDEPENDENT_AMBULATORY_CARE_PROVIDER_SITE_OTHER): Payer: Self-pay | Admitting: *Deleted

## 2019-07-13 ENCOUNTER — Encounter: Payer: Self-pay | Admitting: *Deleted

## 2019-07-13 VITALS — BP 98/62 | HR 106 | Ht 61.0 in

## 2019-07-13 DIAGNOSIS — Z3201 Encounter for pregnancy test, result positive: Secondary | ICD-10-CM

## 2019-07-13 DIAGNOSIS — Z3200 Encounter for pregnancy test, result unknown: Secondary | ICD-10-CM

## 2019-07-13 DIAGNOSIS — O0930 Supervision of pregnancy with insufficient antenatal care, unspecified trimester: Secondary | ICD-10-CM

## 2019-07-13 LAB — POCT PREGNANCY, URINE: Preg Test, Ur: POSITIVE — AB

## 2019-07-13 NOTE — Progress Notes (Signed)
Pt presents for UPT which is positive. Pt informed of results. Approximate LMP 03/10/19 which yields EDD 12/15/19 - now 17w 6d.  Pt reports she had some bright red vaginal bleeding last week which lasted for 3 days. She denies passing clots. She has been feeling fetal movement before the episode of bleeding and also after the bleeding. Pt denies abdominal pain or cramping. Pt instructed to go to [facility] if bleeding reoccurs or significant abdominal/pelvic pain. Pt desires care in this office. She was advised to begin taking prenatal vitamins and to contact Social Services to get Medicaid status changed. Pt voiced understanding of all information and instructions given. She will schedule prenatal care appointments and [place] for anatomy.

## 2019-07-13 NOTE — Progress Notes (Signed)
Patient seen and assessed by nursing staff during this encounter. I have reviewed the chart and agree with the documentation and plan.  Kameka Whan, MD 07/13/2019 2:23 PM   

## 2019-07-24 ENCOUNTER — Encounter (HOSPITAL_COMMUNITY): Payer: Self-pay | Admitting: *Deleted

## 2019-07-26 ENCOUNTER — Other Ambulatory Visit: Payer: Self-pay

## 2019-07-26 ENCOUNTER — Ambulatory Visit (HOSPITAL_COMMUNITY)
Admission: RE | Admit: 2019-07-26 | Discharge: 2019-07-26 | Disposition: A | Discharge status: Home / Self Care | Payer: Medicaid Other | Source: Ambulatory Visit | Attending: Obstetrics and Gynecology | Admitting: Obstetrics and Gynecology

## 2019-07-26 DIAGNOSIS — Z3A24 24 weeks gestation of pregnancy: Secondary | ICD-10-CM

## 2019-07-26 DIAGNOSIS — O0932 Supervision of pregnancy with insufficient antenatal care, second trimester: Secondary | ICD-10-CM

## 2019-07-26 DIAGNOSIS — O321XX0 Maternal care for breech presentation, not applicable or unspecified: Secondary | ICD-10-CM

## 2019-07-26 DIAGNOSIS — Z3200 Encounter for pregnancy test, result unknown: Secondary | ICD-10-CM | POA: Insufficient documentation

## 2019-07-26 DIAGNOSIS — Z3687 Encounter for antenatal screening for uncertain dates: Secondary | ICD-10-CM

## 2019-07-26 DIAGNOSIS — O0930 Supervision of pregnancy with insufficient antenatal care, unspecified trimester: Secondary | ICD-10-CM

## 2019-07-26 DIAGNOSIS — Z363 Encounter for antenatal screening for malformations: Secondary | ICD-10-CM | POA: Diagnosis not present

## 2019-07-28 ENCOUNTER — Encounter: Payer: Self-pay | Admitting: Obstetrics and Gynecology

## 2019-08-23 ENCOUNTER — Ambulatory Visit (INDEPENDENT_AMBULATORY_CARE_PROVIDER_SITE_OTHER): Payer: Medicaid Other | Admitting: *Deleted

## 2019-08-23 ENCOUNTER — Other Ambulatory Visit: Payer: Self-pay

## 2019-08-23 VITALS — Ht 60.0 in

## 2019-08-23 DIAGNOSIS — Z8759 Personal history of other complications of pregnancy, childbirth and the puerperium: Secondary | ICD-10-CM | POA: Insufficient documentation

## 2019-08-23 DIAGNOSIS — O0990 Supervision of high risk pregnancy, unspecified, unspecified trimester: Secondary | ICD-10-CM | POA: Diagnosis not present

## 2019-08-23 HISTORY — DX: Personal history of other complications of pregnancy, childbirth and the puerperium: Z87.59

## 2019-08-23 MED ORDER — BLOOD PRESSURE KIT DEVI: 1.0000 | 0 refills | Status: DC | PRN

## 2019-08-23 NOTE — Patient Instructions (Signed)

## 2019-08-23 NOTE — Progress Notes (Addendum)
I connected with  Grace Becker on 08/23/19 at  1:30 PM EDT by telephone and verified that I am speaking with the correct person using two identifiers.   I discussed the limitations, risks, security and privacy concerns of performing an evaluation and management service by telephone and the availability of in person appointments. I also discussed with the patient that there may be a patient responsible charge related to this service. The patient expressed understanding and agreed to proceed.  I explained I am completing her New OB Intake today. We discussed Her EDD and that it is based on [place] . I reviewed her allergies, meds, OB History, Medical /Surgical history, and appropriate screenings. I informed her of [facility] services.  I explained I will send her the Babyscripts app and app was sent to her while on phone.  I explained we will send a blood pressure cuff prescription to [facility] that will fill that prescription and we called [facility] to verify they received her prescription and confirmed  she will pick up the cuff.  I asked her to bring the blood pressure cuff with her to her first ob appointment so we can show her how to use it. Explained  then we will have her take her blood pressure weekly and enter into the app. I explained she will have some visits in office and some virtually. She already has Sports coach. I reviewed her new ob  appointment date/ time with her , our location and to wear mask, no visitors.  I explained she will have a pelvic exam, ob bloodwork, hemoglobin a1C, cbg ,pap, and  genetic testing if desired,- she does not want a panorama. I scheduled an [place] follow up as ordered  and gave her the appointment. I also offered her that she can get flu shot at her visit. She declines. She voices understanding.  Shaquella Stamant,RN 08/23/2019  1:21 PM

## 2019-08-24 ENCOUNTER — Encounter: Payer: Self-pay | Admitting: Medical

## 2019-08-24 DIAGNOSIS — Z8759 Personal history of other complications of pregnancy, childbirth and the puerperium: Secondary | ICD-10-CM | POA: Insufficient documentation

## 2019-08-24 NOTE — Progress Notes (Signed)
Chart reviewed for nurse visit. Agree with plan of care.   Marny Lowenstein, PA-C 08/24/2019 10:25 AM

## 2019-08-25 ENCOUNTER — Other Ambulatory Visit: Payer: Self-pay

## 2019-08-25 ENCOUNTER — Ambulatory Visit (INDEPENDENT_AMBULATORY_CARE_PROVIDER_SITE_OTHER): Payer: Medicaid Other | Admitting: Medical

## 2019-08-25 ENCOUNTER — Other Ambulatory Visit (HOSPITAL_COMMUNITY)
Admission: RE | Admit: 2019-08-25 | Discharge: 2019-08-25 | Disposition: A | Discharge status: Home / Self Care | Payer: Medicaid Other | Source: Ambulatory Visit | Attending: Medical | Admitting: Medical

## 2019-08-25 ENCOUNTER — Encounter: Payer: Self-pay | Admitting: Medical

## 2019-08-25 VITALS — BP 105/69 | HR 92 | Wt 123.4 lb

## 2019-08-25 DIAGNOSIS — O99891 Other specified diseases and conditions complicating pregnancy: Secondary | ICD-10-CM | POA: Diagnosis not present

## 2019-08-25 DIAGNOSIS — R87610 Atypical squamous cells of undetermined significance on cytologic smear of cervix (ASC-US): Secondary | ICD-10-CM

## 2019-08-25 DIAGNOSIS — O09299 Supervision of pregnancy with other poor reproductive or obstetric history, unspecified trimester: Secondary | ICD-10-CM | POA: Diagnosis not present

## 2019-08-25 DIAGNOSIS — O98313 Other infections with a predominantly sexual mode of transmission complicating pregnancy, third trimester: Secondary | ICD-10-CM | POA: Diagnosis not present

## 2019-08-25 DIAGNOSIS — R8271 Bacteriuria: Secondary | ICD-10-CM | POA: Diagnosis not present

## 2019-08-25 DIAGNOSIS — Z8759 Personal history of other complications of pregnancy, childbirth and the puerperium: Secondary | ICD-10-CM

## 2019-08-25 DIAGNOSIS — O0933 Supervision of pregnancy with insufficient antenatal care, third trimester: Secondary | ICD-10-CM

## 2019-08-25 DIAGNOSIS — O0930 Supervision of pregnancy with insufficient antenatal care, unspecified trimester: Secondary | ICD-10-CM

## 2019-08-25 DIAGNOSIS — A749 Chlamydial infection, unspecified: Secondary | ICD-10-CM | POA: Diagnosis not present

## 2019-08-25 DIAGNOSIS — O0990 Supervision of high risk pregnancy, unspecified, unspecified trimester: Secondary | ICD-10-CM | POA: Insufficient documentation

## 2019-08-25 DIAGNOSIS — O0993 Supervision of high risk pregnancy, unspecified, third trimester: Secondary | ICD-10-CM | POA: Diagnosis not present

## 2019-08-25 NOTE — Progress Notes (Signed)
   PRENATAL VISIT NOTE  Subjective:  Grace Becker is a 24 y.o. G3P1011 at [redacted]w[redacted]d being seen today for her first prenatal visit for this pregnancy.  She is currently monitored for the following issues for this high-risk pregnancy and has Supervision of high risk pregnancy, antepartum; History of pregnancy induced hypertension; and History of preterm premature rupture of membranes (PPROM) on their problem list.  Patient reports no complaints.  Contractions: Not present. Vag. Bleeding: None.  Movement: Present. Denies leaking of fluid.   She is planning to bottle feed. Desires PP IUD for contraception.   The following portions of the patient's history were reviewed and updated as appropriate: allergies, current medications, past family history, past medical history, past social history, past surgical history and problem list.   Objective:   Vitals:   08/25/19 1032  BP: 105/69  Pulse: 92  Weight: 123 lb 6.4 oz (56 kg)    Fetal Status: Fetal Heart Rate (bpm): 140 Fundal Height: 28 cm Movement: Present     General:  Alert, oriented and cooperative. Patient is in no acute distress.  Skin: Skin is warm and dry. No rash noted.   Cardiovascular: Normal heart rate and rhythm noted  Respiratory: Normal respiratory effort, no problems with respiration noted. Clear to auscultation.   Abdomen: Soft, gravid, appropriate for gestational age. Normal bowel sounds. Non-tender. Pain/Pressure: Absent     Pelvic: Cervical exam performed Dilation: Closed Effacement (%): Thick Station: Ballotable Normal cervical contour, no lesions, scant bleeding following pap, small thin yellow discharge  Extremities: Normal range of motion.  Edema: None  Mental Status: Normal mood and affect. Normal behavior. Normal judgment and thought content.   Assessment and Plan:  Pregnancy: G3P1011 at [redacted]w[redacted]d 1. Supervision of high risk pregnancy, antepartum - Culture, OB Urine - Obstetric Panel, Including HIV - TSH -  Cytology - PAP( [place]) - Tdap vaccine greater than or equal to 7yo IM - Hepatitis C antibody - Declined all genetic testing   2. History of pregnancy induced hypertension - Comprehensive metabolic panel - Protein / creatinine ratio, urine  3. Late prenatal care, antepartum - First visit at 28 weeks  - Will scheduled fasting 2 hour ASAP  - Follow-up US on 4/20 for growth and to confirm dating   Preterm labor/first trimester warning symptoms and general obstetric precautions including but not limited to vaginal bleeding, contractions, leaking of fluid and fetal movement were reviewed in detail with the patient. Please refer to After Visit Summary for other counseling recommendations.   Discussed the normal visit cadence for prenatal care Discussed the nature of our practice with multiple providers including residents and students   Return in about 2 weeks (around 09/08/2019) for LOB, Virtual.  Future Appointments  Date Time Provider Department Center  08/29/2019  2:30 PM [facility] [place] 1 [facility]    Vonzella Nipple, PA-C

## 2019-08-25 NOTE — Progress Notes (Signed)
Medicaid Home Form Completed-08/25/19

## 2019-08-25 NOTE — Patient Instructions (Signed)
Safe Medications in Pregnancy   Acne:  Benzoyl Peroxide  Salicylic Acid   Backache/Headache:  Tylenol: 2 regular strength every 4 hours OR        2 Extra strength every 6 hours   Colds/Coughs/Allergies:  Benadryl (alcohol free) 25 mg every 6 hours as needed  Breath right strips  Claritin  Cepacol throat lozenges  Chloraseptic throat spray  Cold-Eeze- up to three times per day  Cough drops, alcohol free  Flonase (by prescription only)  Guaifenesin  Mucinex  Robitussin DM (plain only, alcohol free)  Saline nasal spray/drops  Sudafed (pseudoephedrine) & Actifed * use only after [redacted] weeks gestation and if you do not have high blood pressure  Tylenol  Vicks Vaporub  Zinc lozenges  Zyrtec   Constipation:  Colace  Ducolax suppositories  Fleet enema  Glycerin suppositories  Metamucil  Milk of magnesia  Miralax  Senokot  Smooth move tea   Diarrhea:  Kaopectate  Imodium A-D   *NO pepto Bismol   Hemorrhoids:  Anusol  Anusol HC  Preparation H  Tucks   Indigestion:  Tums  Maalox  Mylanta  Zantac  Pepcid   Insomnia:  Benadryl (alcohol free) 25mg every 6 hours as needed  Tylenol PM  Unisom, no Gelcaps   Leg Cramps:  Tums  MagGel   Nausea/Vomiting:  Bonine  Dramamine  Emetrol  Ginger extract  Sea bands  Meclizine  Nausea medication to take during pregnancy:  Unisom (doxylamine succinate 25 mg tablets) Take one tablet daily at bedtime. If symptoms are not adequately controlled, the dose can be increased to a maximum recommended dose of two tablets daily (1/2 tablet in the morning, 1/2 tablet mid-afternoon and one at bedtime).  Vitamin B6 100mg tablets. Take one tablet twice a day (up to 200 mg per day).   Skin Rashes:  Aveeno products  Benadryl cream or 25mg every 6 hours as needed  Calamine Lotion  1% cortisone cream   Yeast infection:  Gyne-lotrimin 7  Monistat 7    **If taking multiple medications, please check labels to avoid  duplicating the same active ingredients  **take medication as directed on the label  ** Do not exceed 4000 mg of tylenol in 24 hours  **Do not take medications that contain aspirin or ibuprofen         Childbirth Education Options: [facility] Classes:  Childbirth education classes can help you get ready for a positive parenting experience. You can also meet other expectant parents and get free stuff for your baby. Each class runs for five weeks on the same night and costs $45 for the mother-to-be and her support person. Medicaid covers the cost if you are eligible. Call [phone] to register. [facility] Childbirth Education:  [phone] or [phone] or [email or web address]  Baby & Me Class: Discuss newborn & infant parenting and family adjustment issues with other new mothers in a relaxed environment. Each week brings a new speaker or baby-centered activity. We encourage new mothers to join us every Thursday at 11:00am. Babies birth until crawling. No registration or fee. Daddy Boot Camp: This course offers Dads-to-be the tools and knowledge needed to feel confident on their journey to becoming new fathers. Experienced dads, who have been trained as coaches, teach dads-to-be how to hold, comfort, diaper, swaddle and play with their infant while being able to support the new mom as well. A class for men taught by men. $25/dad Big Brother/Big Sister: Let your children share in the joy   mothers as they journey through the adjustments and struggles of that sometimes overwhelming first year after the birth of a child. Tuesdays at 10:00am and  Thursdays at 6:00pm. Babies welcome. No registration or fee. Breastfeeding Support Group: This group is a mother-to-mother support circle where moms have the opportunity to share their breastfeeding experiences. A Lactation Consultant is present for questions and concerns. Meets each Tuesday at 11:00am. No fee or registration. Breastfeeding Your Baby: Learn what to expect in the first days of breastfeeding your newborn.  This class will help you feel more confident with the skills needed to begin your breastfeeding experience. Many new mothers are concerned about breastfeeding after leaving the hospital. This class will also address the most common fears and challenges about breastfeeding during the first few weeks, months and beyond. (call for fee) Comfort Techniques and Tour: This 2 hour interactive class will provide you the opportunity to learn & practice hands-on techniques that can help relieve some of the discomfort of labor and encourage your baby to rotate toward the best position for birth. You and your partner will be able to try a variety of labor positions with birth balls and rebozos as well as practice breathing, relaxation, and visualization techniques. A tour of the [facility] is included with this class. $20 per registrant and support person Childbirth Class- Weekend Option: This class is a Weekend version of our Birth & Baby series. It is designed for parents who have a difficult time fitting several weeks of classes into their schedule. It covers the care of your newborn and the basics of labor and childbirth. It also includes a Maternity Care Center Tour of [facility] and lunch. The class is held two consecutive days: beginning on Friday evening from 6:30 - 8:30 p.m. and the next day, Saturday from 9 a.m. - 4 p.m. (call for fee) Waterbirth Class: Interested in a waterbirth?  This informational class will help you discover whether waterbirth is the right fit  for you. Education about waterbirth itself, supplies you would need and how to assemble your support team is what you can expect from this class. Some obstetrical practices require this class in order to pursue a waterbirth. (Not all obstetrical practices offer waterbirth-check with your healthcare provider.) Register only the expectant mom, but you are encouraged to bring your partner to class! Required if planning waterbirth, no fee. Infant/Child CPR: Parents, grandparents, babysitters, and friends learn Cardio-Pulmonary Resuscitation skills for infants and children. You will also learn how to treat both conscious and unconscious choking in infants and children. This Family & Friends program does not offer certification. Register each participant individually to ensure that enough mannequins are available. (Call for fee) Grandparent Love: Expecting a grandbaby? This class is for you! Learn about the latest infant care and safety recommendations and ways to support your own child as he or she transitions into the parenting role. Taught by Registered Nurses who are childbirth instructors, but most importantly...they are grandmothers too! $10/person. Childbirth Class- Natural Childbirth: This series of 5 weekly classes is for expectant parents who want to learn and practice natural methods of coping with the process of labor and childbirth. Relaxation, breathing, massage, visualization, role of the partner, and helpful positioning are highlighted. Participants learn how to be confident in their body's ability to give birth. This class will empower and help parents make informed decisions about their own care. Includes discussion that will help new parents transition into the immediate postpartum period. Maternity   who want to learn and practice natural methods of coping with the process of labor and childbirth. Relaxation, breathing, massage, visualization, role of the partner, and helpful positioning are highlighted. Participants learn how to be confident in their body's ability to give birth. This class will empower and help parents make informed decisions about their own care. Includes discussion that will help new parents transition into the immediate postpartum period. Maternity Care Center Tour of [facility] is included. We suggest taking this class between 25-32 weeks, but  it's only a recommendation. $75 per registrant and one support person or $30 Medicaid. Childbirth Class- 3 week Series: This option of 3 weekly classes helps you and your labor partner prepare for childbirth. Newborn care, labor & birth, cesarean birth, pain management, and comfort techniques are discussed and a Maternity Care Center Tour of [facility] is included. The class meets at the same time, on the same day of the week for 3 consecutive weeks beginning with the starting date you choose. $60 for registrant and one support person.  Marvelous Multiples: Expecting twins, triplets, or more? This class covers the differences in labor, birth, parenting, and breastfeeding issues that face multiples' parents. NICU tour is included. Led by a Certified Childbirth Educator who is the mother of twins. No fee. Caring for Baby: This class is for expectant and adoptive parents who want to learn and practice the most up-to-date newborn care for their babies. Focus is on birth through the first six weeks of life. Topics include feeding, bathing, diapering, crying, umbilical cord care, circumcision care and safe sleep. Parents learn to recognize symptoms of illness and when to call the pediatrician. Register only the mom-to-be and your partner or support person can plan to come with you! $10 per registrant and support person Childbirth Class- online option: This online class offers you the freedom to complete a Birth and Baby series in the comfort of your own home. The flexibility of this option allows you to review sections at your own pace, at times convenient to you and your support people. It includes additional video information, animations, quizzes, and extended activities. Get organized with helpful eClass tools, checklists, and trackers. Once you register online for the class, you will receive an email within a few days to accept the invitation and begin the class when the time is right for you. The content  will be available to you for 60 days. $60 for 60 days of online access for you and your support people.         

## 2019-08-26 LAB — COMPREHENSIVE METABOLIC PANEL
ALT: 10 IU/L (ref 0–32)
AST: 23 IU/L (ref 0–40)
Albumin/Globulin Ratio: 1.3 (ref 1.2–2.2)
Albumin: 3.9 g/dL (ref 3.9–5.0)
Alkaline Phosphatase: 94 IU/L (ref 39–117)
BUN/Creatinine Ratio: 17 (ref 9–23)
BUN: 8 mg/dL (ref 6–20)
Bilirubin Total: <0.2 mg/dL (ref 0.0–1.2)
CO2: 19 mmol/L — ABNORMAL LOW (ref 20–29)
Calcium: 9 mg/dL (ref 8.7–10.2)
Chloride: 102 mmol/L (ref 96–106)
Creatinine, Ser: 0.47 mg/dL — ABNORMAL LOW (ref 0.57–1.00)
GFR calc Af Amer: 161 mL/min/{1.73_m2} (ref >59)
GFR calc non Af Amer: 140 mL/min/{1.73_m2} (ref >59)
Globulin, Total: 3 g/dL (ref 1.5–4.5)
Glucose: 69 mg/dL (ref 65–99)
Potassium: 4.1 mmol/L (ref 3.5–5.2)
Sodium: 138 mmol/L (ref 134–144)
Total Protein: 6.9 g/dL (ref 6.0–8.5)

## 2019-08-26 LAB — OBSTETRIC PANEL, INCLUDING HIV
Antibody Screen: NEGATIVE
Basophils Absolute: 0 10*3/uL (ref 0.0–0.2)
Basos: 0 %
EOS (ABSOLUTE): 0 10*3/uL (ref 0.0–0.4)
Eos: 0 %
HIV Screen 4th Generation wRfx: NONREACTIVE
Hematocrit: 26.5 % — ABNORMAL LOW (ref 34.0–46.6)
Hemoglobin: 8.7 g/dL — ABNORMAL LOW (ref 11.1–15.9)
Hepatitis B Surface Ag: NEGATIVE
Immature Grans (Abs): 0.1 10*3/uL (ref 0.0–0.1)
Immature Granulocytes: 1 %
Lymphocytes Absolute: 2.1 10*3/uL (ref 0.7–3.1)
Lymphs: 20 %
MCH: 26.4 pg — ABNORMAL LOW (ref 26.6–33.0)
MCHC: 32.8 g/dL (ref 31.5–35.7)
MCV: 80 fL (ref 79–97)
Monocytes Absolute: 0.7 10*3/uL (ref 0.1–0.9)
Monocytes: 7 %
Neutrophils Absolute: 7.7 10*3/uL — ABNORMAL HIGH (ref 1.4–7.0)
Neutrophils: 72 %
Platelets: 225 10*3/uL (ref 150–450)
RBC: 3.3 x10E6/uL — ABNORMAL LOW (ref 3.77–5.28)
RDW: 13.4 % (ref 11.7–15.4)
RPR Ser Ql: NONREACTIVE
Rh Factor: POSITIVE
Rubella Antibodies, IGG: 1.77 index (ref >0.99)
WBC: 10.7 10*3/uL (ref 3.4–10.8)

## 2019-08-26 LAB — PROTEIN / CREATININE RATIO, URINE
Creatinine, Urine: 51.7 mg/dL
Protein, Ur: 12.1 mg/dL
Protein/Creat Ratio: 234 mg/g creat — ABNORMAL HIGH (ref 0–200)

## 2019-08-26 LAB — HEPATITIS C ANTIBODY: Hep C Virus Ab: 0.1 s/co ratio (ref 0.0–0.9)

## 2019-08-26 LAB — TSH: TSH: 2.63 u[IU]/mL (ref 0.450–4.500)

## 2019-08-28 ENCOUNTER — Encounter: Payer: Self-pay | Admitting: Medical

## 2019-08-28 DIAGNOSIS — R8271 Bacteriuria: Secondary | ICD-10-CM | POA: Insufficient documentation

## 2019-08-28 LAB — URINE CULTURE, OB REFLEX

## 2019-08-28 LAB — CULTURE, OB URINE

## 2019-08-28 MED ORDER — CEPHALEXIN 500 MG PO CAPS: 500.0000 mg | ORAL_CAPSULE | Freq: Four times a day (QID) | ORAL | 0 refills | Status: DC

## 2019-08-28 NOTE — Addendum Note (Signed)
Addended by: Marny Lowenstein on: 08/28/2019 01:47 PM   Modules accepted: Orders

## 2019-08-29 ENCOUNTER — Ambulatory Visit (HOSPITAL_COMMUNITY)
Admission: RE | Admit: 2019-08-29 | Discharge: 2019-08-29 | Disposition: A | Discharge status: Home / Self Care | Payer: Medicaid Other | Source: Ambulatory Visit | Attending: Medical | Admitting: Medical

## 2019-08-29 ENCOUNTER — Other Ambulatory Visit: Payer: Self-pay

## 2019-08-29 ENCOUNTER — Encounter: Payer: Self-pay | Admitting: *Deleted

## 2019-08-29 DIAGNOSIS — O0990 Supervision of high risk pregnancy, unspecified, unspecified trimester: Secondary | ICD-10-CM | POA: Insufficient documentation

## 2019-08-29 DIAGNOSIS — Z3A29 29 weeks gestation of pregnancy: Secondary | ICD-10-CM | POA: Diagnosis not present

## 2019-08-29 DIAGNOSIS — Z8759 Personal history of other complications of pregnancy, childbirth and the puerperium: Secondary | ICD-10-CM | POA: Insufficient documentation

## 2019-08-29 DIAGNOSIS — Z362 Encounter for other antenatal screening follow-up: Secondary | ICD-10-CM | POA: Diagnosis not present

## 2019-08-30 LAB — CYTOLOGY - PAP
Chlamydia: POSITIVE — AB
Comment: NEGATIVE
Comment: NEGATIVE
Comment: NORMAL
Diagnosis: UNDETERMINED — AB
High risk HPV: NEGATIVE
Neisseria Gonorrhea: NEGATIVE

## 2019-08-31 ENCOUNTER — Other Ambulatory Visit: Payer: Medicaid Other

## 2019-08-31 ENCOUNTER — Encounter: Payer: Self-pay | Admitting: Medical

## 2019-08-31 ENCOUNTER — Other Ambulatory Visit: Payer: Self-pay

## 2019-08-31 DIAGNOSIS — O0990 Supervision of high risk pregnancy, unspecified, unspecified trimester: Secondary | ICD-10-CM

## 2019-08-31 DIAGNOSIS — A749 Chlamydial infection, unspecified: Secondary | ICD-10-CM | POA: Insufficient documentation

## 2019-08-31 MED ORDER — AZITHROMYCIN 250 MG PO TABS: 1000.0000 mg | ORAL_TABLET | Freq: Once | ORAL | 0 refills | Status: AC

## 2019-08-31 NOTE — Addendum Note (Signed)
Addended by: Marny Lowenstein on: 08/31/2019 08:38 AM   Modules accepted: Orders

## 2019-09-01 LAB — GLUCOSE TOLERANCE, 2 HOURS W/ 1HR
Glucose, 1 hour: 124 mg/dL (ref 65–179)
Glucose, 2 hour: 107 mg/dL (ref 65–152)
Glucose, Fasting: 74 mg/dL (ref 65–91)

## 2019-09-04 ENCOUNTER — Telehealth: Payer: Self-pay | Admitting: *Deleted

## 2019-09-04 DIAGNOSIS — R8271 Bacteriuria: Secondary | ICD-10-CM

## 2019-09-04 DIAGNOSIS — A599 Trichomoniasis, unspecified: Secondary | ICD-10-CM

## 2019-09-04 DIAGNOSIS — O98813 Other maternal infectious and parasitic diseases complicating pregnancy, third trimester: Secondary | ICD-10-CM

## 2019-09-04 DIAGNOSIS — A749 Chlamydial infection, unspecified: Secondary | ICD-10-CM

## 2019-09-04 MED ORDER — TINIDAZOLE 500 MG PO TABS: 2.0000 g | ORAL_TABLET | Freq: Once | ORAL | 0 refills | Status: AC

## 2019-09-04 MED ORDER — AZITHROMYCIN 250 MG PO TABS: 1000.0000 mg | ORAL_TABLET | Freq: Once | ORAL | 0 refills | Status: DC

## 2019-09-04 MED ORDER — SULFAMETHOXAZOLE-TRIMETHOPRIM 800-160 MG PO TABS: 1.0000 | ORAL_TABLET | Freq: Two times a day (BID) | ORAL | 0 refills | Status: DC

## 2019-09-04 NOTE — Telephone Encounter (Addendum)
-----   Message from Marny Lowenstein, PA-C sent at 08/31/2019  8:38 AM EDT ----- ASCUS pap with negative HPV, at her age does not require additional testing just routine screening.   + Chlamydia. Patient needs treatment. Rx sent. Please inform patient of need for partner treatment by phone and send STD report to [facility].   Vonzella Nipple, PA-C 08/31/2019 8:37 AM   4/26  0940  Called pt and informed her of +Chlamydia requiring treatment. Rx has been sent to pharmacy and her partner will require treatment as well. No sex for either of them until 2 weeks after both have received treatment. Pt stated that she has not yet obtained Rx for UTI. Per Dr. Vergie Living, Rx was changed to Bactrim DS. Pt voiced understanding of all information and instructions given.  1220  Called pt and left VM message stating that she also tested positive for Trichomonas. Rx sent to pharmacy and partner will need treatment also. Dosing instructions given for all antibiotics.

## 2019-09-12 ENCOUNTER — Telehealth (INDEPENDENT_AMBULATORY_CARE_PROVIDER_SITE_OTHER): Payer: Medicaid Other | Admitting: Student

## 2019-09-12 DIAGNOSIS — A749 Chlamydial infection, unspecified: Secondary | ICD-10-CM

## 2019-09-12 DIAGNOSIS — R8271 Bacteriuria: Secondary | ICD-10-CM

## 2019-09-12 DIAGNOSIS — O99891 Other specified diseases and conditions complicating pregnancy: Secondary | ICD-10-CM

## 2019-09-12 DIAGNOSIS — Z8759 Personal history of other complications of pregnancy, childbirth and the puerperium: Secondary | ICD-10-CM | POA: Diagnosis not present

## 2019-09-12 DIAGNOSIS — O0990 Supervision of high risk pregnancy, unspecified, unspecified trimester: Secondary | ICD-10-CM

## 2019-09-12 DIAGNOSIS — Z3A31 31 weeks gestation of pregnancy: Secondary | ICD-10-CM

## 2019-09-12 DIAGNOSIS — O09293 Supervision of pregnancy with other poor reproductive or obstetric history, third trimester: Secondary | ICD-10-CM

## 2019-09-12 DIAGNOSIS — O98813 Other maternal infectious and parasitic diseases complicating pregnancy, third trimester: Secondary | ICD-10-CM

## 2019-09-12 DIAGNOSIS — O98313 Other infections with a predominantly sexual mode of transmission complicating pregnancy, third trimester: Secondary | ICD-10-CM | POA: Diagnosis not present

## 2019-09-12 NOTE — Progress Notes (Deleted)
Patient ID: Grace Becker, female   DOB: [birth year], 24 y.o.   MRN: [id number]   PRENATAL VISIT NOTE  Subjective:  Grace Becker is a 24 y.o. G3P1011 at [redacted]w[redacted]d being seen today for ongoing prenatal care.  She is currently monitored for the following issues for this low-risk pregnancy and has Supervision of high risk pregnancy, antepartum; History of pregnancy induced hypertension; Asymptomatic bacteriuria during pregnancy in third trimester; and Chlamydia infection affecting pregnancy in third trimester, antepartum on their problem list.  Patient reports no complaints. She has not picked her antibiotics for UTI, trich or chlamydia. Has not been sexually active since diagnosis.  Reports that her BPs have been 121/70s.  Contractions: Not present. Vag. Bleeding: None.  Movement: Present. Denies leaking of fluid.   The following portions of the patient's history were reviewed and updated as appropriate: allergies, current medications, past family history, past medical history, past social history, past surgical history and problem list.   Objective:   Vitals:   09/12/19 0955  BP: 131/75  Pulse: (!) 105    Fetal Status:     Movement: Present     General:  Alert, oriented and cooperative. Patient is in no acute distress.  Skin: Skin is warm and dry. No rash noted.   Cardiovascular: Normal heart rate noted  Respiratory: Normal respiratory effort, no problems with respiration noted  Abdomen: Soft, gravid, appropriate for gestational age.  Pain/Pressure: Absent     Pelvic: {Blank single:19197::"Cervical exam performed in the presence of a chaperone","Cervical exam deferred"}        Extremities: Normal range of motion.  Edema: None  Mental Status: Normal mood and affect. Normal behavior. Normal judgment and thought content.   Assessment and Plan:  Pregnancy: G3P1011 at [redacted]w[redacted]d 1. Supervision of high risk pregnancy, antepartum  2. Chlamydia infection affecting pregnancy in third trimester,  antepartum -will pick up meds today, emphasized importance of taking medication; she agrees to do today before the pharmacy closes.    3. Asymptomatic bacteriuria during pregnancy in third trimester -will pick up meds today  4. History of pregnancy induced hypertension   {Blank single:19197::"Term","Preterm"} labor symptoms and general obstetric precautions including but not limited to vaginal bleeding, contractions, leaking of fluid and fetal movement were reviewed in detail with the patient. Please refer to After Visit Summary for other counseling recommendations.   No follow-ups on file.  No future appointments.  Marylene Land, CNM

## 2019-09-12 NOTE — Patient Instructions (Signed)
Preeclampsia and Eclampsia Preeclampsia is a serious condition that may develop during pregnancy. This condition causes high blood pressure and increased protein in your urine along with other symptoms, such as headaches and vision changes. These symptoms may develop as the condition gets worse. Preeclampsia may occur at 20 weeks of pregnancy or later. Diagnosing and treating preeclampsia early is very important. If not treated early, it can cause serious problems for you and your baby. One problem it can lead to is eclampsia. Eclampsia is a condition that causes muscle jerking or shaking (convulsions or seizures) and other serious problems for the mother. During pregnancy, delivering your baby may be the best treatment for preeclampsia or eclampsia. For most women, preeclampsia and eclampsia symptoms go away after giving birth. In rare cases, a woman may develop preeclampsia after giving birth (postpartum preeclampsia). This usually occurs within 48 hours after childbirth but may occur up to 6 weeks after giving birth. What are the causes? The cause of preeclampsia is not known. What increases the risk? The following risk factors make you more likely to develop preeclampsia:  Being pregnant for the first time.  Having had preeclampsia during a past pregnancy.  Having a family history of preeclampsia.  Having high blood pressure.  Being pregnant with more than one baby.  Being 35 or older.  Being African-American.  Having kidney disease or diabetes.  Having medical conditions such as lupus or blood diseases.  Being very overweight (obese). What are the signs or symptoms? The most common symptoms are:  Severe headaches.  Vision problems, such as blurred or double vision.  Abdominal pain, especially upper abdominal pain. Other symptoms that may develop as the condition gets worse include:  Sudden weight gain.  Sudden swelling of the hands, face, legs, and feet.  Severe nausea  and vomiting.  Numbness in the face, arms, legs, and feet.  Dizziness.  Urinating less than usual.  Slurred speech.  Convulsions or seizures. How is this diagnosed? There are no screening tests for preeclampsia. Your health care provider will ask you about symptoms and check for signs of preeclampsia during your prenatal visits. You may also have tests that include:  Checking your blood pressure.  Urine tests to check for protein. Your health care provider will check for this at every prenatal visit.  Blood tests.  Monitoring your baby's heart rate.  Ultrasound. How is this treated? You and your health care provider will determine the treatment approach that is best for you. Treatment may include:  Having more frequent prenatal exams to check for signs of preeclampsia, if you have an increased risk for preeclampsia.  Medicine to lower your blood pressure.  Staying in the hospital, if your condition is severe. There, treatment will focus on controlling your blood pressure and the amount of fluids in your body (fluid retention).  Taking medicine (magnesium sulfate) to prevent seizures. This may be given as an injection or through an IV.  Taking a low-dose aspirin during your pregnancy.  Delivering your baby early. You may have your labor started with medicine (induced), or you may have a cesarean delivery. Follow these instructions at home: Eating and drinking   Drink enough fluid to keep your urine pale yellow.  Avoid caffeine. Lifestyle  Do not use any products that contain nicotine or tobacco, such as cigarettes and e-cigarettes. If you need help quitting, ask your health care provider.  Do not use alcohol or drugs.  Avoid stress as much as possible. Rest and get   plenty of sleep. General instructions  Take over-the-counter and prescription medicines only as told by your health care provider.  When lying down, lie on your left side. This keeps pressure off your  major blood vessels.  When sitting or lying down, raise (elevate) your feet. Try putting some pillows underneath your lower legs.  Exercise regularly. Ask your health care provider what kinds of exercise are best for you.  Keep all follow-up and prenatal visits as told by your health care provider. This is important. How is this prevented? There is no known way of preventing preeclampsia or eclampsia from developing. However, to lower your risk of complications and detect problems early:  Get regular prenatal care. Your health care provider may be able to diagnose and treat the condition early.  Maintain a healthy weight. Ask your health care provider for help managing weight gain during pregnancy.  Work with your health care provider to manage any long-term (chronic) health conditions you have, such as diabetes or kidney problems.  You may have tests of your blood pressure and kidney function after giving birth.  Your health care provider may have you take low-dose aspirin during your next pregnancy. Contact a health care provider if:  You have symptoms that your health care provider told you may require more treatment or monitoring, such as: ? Headaches. ? Nausea or vomiting. ? Abdominal pain. ? Dizziness. ? Light-headedness. Get help right away if:  You have severe: ? Abdominal pain. ? Headaches that do not get better. ? Dizziness. ? Vision problems. ? Confusion. ? Nausea or vomiting.  You have any of the following: ? A seizure. ? Sudden, rapid weight gain. ? Sudden swelling in your hands, ankles, or face. ? Trouble moving any part of your body. ? Numbness in any part of your body. ? Trouble speaking. ? Abnormal bleeding.  You faint. Summary  Preeclampsia is a serious condition that may develop during pregnancy.  This condition causes high blood pressure and increased protein in your urine along with other symptoms, such as headaches and vision  changes.  Diagnosing and treating preeclampsia early is very important. If not treated early, it can cause serious problems for you and your baby.  Get help right away if you have symptoms that your health care provider told you to watch for. This information is not intended to replace advice given to you by your health care provider. Make sure you discuss any questions you have with your health care provider. Document Revised: 12/28/2017 Document Reviewed: 12/02/2015 Elsevier Patient Education  2020 [employer]  

## 2019-09-12 NOTE — Progress Notes (Signed)
I connected with  Grace Becker on 09/12/19 at  9:55 AM EDT by telephone and verified that I am speaking with the correct person using two identifiers.   I discussed the limitations, risks, security and privacy concerns of performing an evaluation and management service by telephone and the availability of in person appointments. I also discussed with the patient that there may be a patient responsible charge related to this service. The patient expressed understanding and agreed to proceed.  [name], CMA 09/12/2019  9:52 AM

## 2019-09-12 NOTE — Progress Notes (Signed)
Patient ID: Grace Becker, female   DOB: 12-10-1995, 24 y.o.   MRN: [id number] I connected with@ on 09/12/19 at  9:55 AM EDT by: My Chart  and verified that I am speaking with the correct person using two identifiers.  Patient is located at home and provider is located at [facility] .     The purpose of this virtual visit is to provide medical care while limiting exposure to the novel coronavirus. I discussed the limitations, risks, security and privacy concerns of performing an evaluation and management service by My Chart and the availability of in person appointments. I also discussed with the patient that there may be a patient responsible charge related to this service. By engaging in this virtual visit, you consent to the provision of healthcare.  Additionally, you authorize for your insurance to be billed for the services provided during this visit.  The patient expressed understanding and agreed to proceed.  The following staff members participated in the virtual visit:  Carver Fila    PRENATAL VISIT NOTE  Subjective:  Grace Becker is a 24 y.o. G3P1011 at [redacted]w[redacted]d  for phone visit for ongoing prenatal care.  She is currently monitored for the following issues for this low-risk pregnancy and has Supervision of high risk pregnancy, antepartum; History of pregnancy induced hypertension; Asymptomatic bacteriuria during pregnancy in third trimester; and Chlamydia infection affecting pregnancy in third trimester, antepartum on their problem list.  Patient reports no complaints. Patient reports no complaints. She has not picked her antibiotics for UTI, trich or chlamydia. Has not been sexually active since diagnosis.  Reports that her BPs have been 121/70s.  Contractions: Not present. Vag. Bleeding: None.  Movement: Present. Denies leaking of fluid.   Contractions: Not present. Vag. Bleeding: None.  Movement: Present. Denies leaking of fluid.   The following portions of the patient's history were  reviewed and updated as appropriate: allergies, current medications, past family history, past medical history, past social history, past surgical history and problem list.   Objective:   Vitals:   09/12/19 0955  BP: 131/75  Pulse: (!) 105   Self-Obtained  Fetal Status:     Movement: Present     Assessment and Plan:  Pregnancy: G3P1011 at [redacted]w[redacted]d 1. Supervision of high risk pregnancy, antepartum -discussed inpatient IUD, patient desires this method of BC  2. Chlamydia infection affecting pregnancy in third trimester, antepartum -will pick up meds today, emphasized importance of taking medication; she agrees to do today before the pharmacy closes.  - If pharmacy has canceled prescription and she needs a new one, she should send a MyChart message and we can resend her antibiotics.   3. Asymptomatic bacteriuria during pregnancy in third trimester -will pick up meds today, emphasized importance of taking medication; she agrees to do today before the pharmacy closes.  - If pharmacy has canceled prescription and she needs a new one, she should send a MyChart message and we can resend her antibiotics.    4.History of pregnancy induced hypertension Reviewed warning blood pressure values (systolic = / > 144 and/or diastolic =/> 90). Explained that, if blood pressure is elevated, she should sit down, rest, and eat/drink something. If still elevated 15 minutes later, and she is greater than 20 weeks, she should call clinic or come to MAU. She should come to MAU if she has elevated pressures and any of the following:  - headache not relieved with tylenol, rest, hydration -blurry vision, floating spots in her vision -  sudden full-body edema or facial edema -RUQ pain that is constant.  These symptoms may indicate that her blood pressure is worsening and she may be developing gestational hypertension or pre-eclampsia, which is an emergency.    Will draw baseline pre-eclamptic labs at next visit.    Preterm labor symptoms and general obstetric precautions including but not limited to vaginal bleeding, contractions, leaking of fluid and fetal movement were reviewed in detail with the patient.  Return in about 3 weeks (around 10/03/2019), or for in person with MD plus needs to leave urine and TOC.  Future Appointments  Date Time Provider Department Center  10/06/2019  9:15 AM Marlowe Alt, DO [facility]     Time spent on virtual visit:  25 minutes  Marylene Land, CNM

## 2019-09-20 ENCOUNTER — Telehealth: Payer: Self-pay | Admitting: General Practice

## 2019-09-20 NOTE — Telephone Encounter (Signed)
Called patient's pharmacy to confirm tindamax was available for pick up. Pharmacy states medication is available for pick up along with bactrim & zithromax but will be taken off the shelf tomorrow. Called patient, no answer- left message stating medications are still available for pickup at the pharmacy.

## 2019-10-06 ENCOUNTER — Encounter: Payer: Medicaid Other | Admitting: Family Medicine

## 2019-10-17 ENCOUNTER — Telehealth: Payer: Self-pay | Admitting: Family Medicine

## 2019-10-17 ENCOUNTER — Encounter: Payer: Medicaid Other | Admitting: Family Medicine

## 2019-10-17 NOTE — Telephone Encounter (Signed)
Attempted to call patient about rescheduling her appointment. Left a detailed message for her to call the office to reschedule her appointment.

## 2019-11-05 ENCOUNTER — Other Ambulatory Visit: Payer: Self-pay

## 2019-11-09 ENCOUNTER — Encounter (HOSPITAL_COMMUNITY): Payer: Self-pay

## 2019-11-09 ENCOUNTER — Other Ambulatory Visit: Payer: Self-pay

## 2019-11-09 ENCOUNTER — Inpatient Hospital Stay (HOSPITAL_COMMUNITY)
Admission: AD | Admit: 2019-11-09 | Discharge: 2019-11-11 | DRG: 806 | Disposition: A | Discharge status: Home / Self Care | Payer: Medicaid Other | Attending: Obstetrics & Gynecology | Admitting: Obstetrics & Gynecology

## 2019-11-09 ENCOUNTER — Inpatient Hospital Stay (HOSPITAL_COMMUNITY): Payer: Medicaid Other | Admitting: Anesthesiology

## 2019-11-09 DIAGNOSIS — O9832 Other infections with a predominantly sexual mode of transmission complicating childbirth: Secondary | ICD-10-CM | POA: Diagnosis not present

## 2019-11-09 DIAGNOSIS — A5901 Trichomonal vulvovaginitis: Secondary | ICD-10-CM | POA: Diagnosis not present

## 2019-11-09 DIAGNOSIS — Z975 Presence of (intrauterine) contraceptive device: Secondary | ICD-10-CM

## 2019-11-09 DIAGNOSIS — Z20822 Contact with and (suspected) exposure to covid-19: Secondary | ICD-10-CM | POA: Diagnosis not present

## 2019-11-09 DIAGNOSIS — A749 Chlamydial infection, unspecified: Secondary | ICD-10-CM | POA: Diagnosis not present

## 2019-11-09 DIAGNOSIS — O4292 Full-term premature rupture of membranes, unspecified as to length of time between rupture and onset of labor: Secondary | ICD-10-CM | POA: Diagnosis not present

## 2019-11-09 DIAGNOSIS — O139 Gestational [pregnancy-induced] hypertension without significant proteinuria, unspecified trimester: Secondary | ICD-10-CM

## 2019-11-09 DIAGNOSIS — O4290 Premature rupture of membranes, unspecified as to length of time between rupture and onset of labor, unspecified weeks of gestation: Secondary | ICD-10-CM | POA: Diagnosis present

## 2019-11-09 DIAGNOSIS — O9902 Anemia complicating childbirth: Secondary | ICD-10-CM | POA: Diagnosis not present

## 2019-11-09 DIAGNOSIS — O164 Unspecified maternal hypertension, complicating childbirth: Secondary | ICD-10-CM | POA: Diagnosis not present

## 2019-11-09 DIAGNOSIS — O23593 Infection of other part of genital tract in pregnancy, third trimester: Secondary | ICD-10-CM | POA: Diagnosis present

## 2019-11-09 DIAGNOSIS — Z30017 Encounter for initial prescription of implantable subdermal contraceptive: Secondary | ICD-10-CM | POA: Diagnosis not present

## 2019-11-09 DIAGNOSIS — D649 Anemia, unspecified: Secondary | ICD-10-CM | POA: Diagnosis present

## 2019-11-09 DIAGNOSIS — Z3A39 39 weeks gestation of pregnancy: Secondary | ICD-10-CM

## 2019-11-09 DIAGNOSIS — O134 Gestational [pregnancy-induced] hypertension without significant proteinuria, complicating childbirth: Secondary | ICD-10-CM | POA: Diagnosis present

## 2019-11-09 DIAGNOSIS — Z8759 Personal history of other complications of pregnancy, childbirth and the puerperium: Secondary | ICD-10-CM

## 2019-11-09 DIAGNOSIS — O0990 Supervision of high risk pregnancy, unspecified, unspecified trimester: Secondary | ICD-10-CM

## 2019-11-09 DIAGNOSIS — A5602 Chlamydial vulvovaginitis: Secondary | ICD-10-CM | POA: Diagnosis not present

## 2019-11-09 DIAGNOSIS — Z03818 Encounter for observation for suspected exposure to other biological agents ruled out: Secondary | ICD-10-CM | POA: Diagnosis not present

## 2019-11-09 DIAGNOSIS — O98813 Other maternal infectious and parasitic diseases complicating pregnancy, third trimester: Secondary | ICD-10-CM | POA: Diagnosis present

## 2019-11-09 LAB — COMPREHENSIVE METABOLIC PANEL
ALT: 11 U/L (ref 0–44)
AST: 16 U/L (ref 15–41)
Albumin: 2.7 g/dL — ABNORMAL LOW (ref 3.5–5.0)
Alkaline Phosphatase: 169 U/L — ABNORMAL HIGH (ref 38–126)
Anion gap: 8 (ref 5–15)
BUN: 6 mg/dL (ref 6–20)
CO2: 21 mmol/L — ABNORMAL LOW (ref 22–32)
Calcium: 9 mg/dL (ref 8.9–10.3)
Chloride: 107 mmol/L (ref 98–111)
Creatinine, Ser: 0.49 mg/dL (ref 0.44–1.00)
GFR calc Af Amer: >60 mL/min (ref >60)
GFR calc non Af Amer: >60 mL/min (ref >60)
Glucose, Bld: 98 mg/dL (ref 70–99)
Potassium: 3.8 mmol/L (ref 3.5–5.1)
Sodium: 136 mmol/L (ref 135–145)
Total Bilirubin: 0.2 mg/dL — ABNORMAL LOW (ref 0.3–1.2)
Total Protein: 6.5 g/dL (ref 6.5–8.1)

## 2019-11-09 LAB — CBC
HCT: 26.2 % — ABNORMAL LOW (ref 36.0–46.0)
Hemoglobin: 8.3 g/dL — ABNORMAL LOW (ref 12.0–15.0)
MCH: 24.5 pg — ABNORMAL LOW (ref 26.0–34.0)
MCHC: 31.7 g/dL (ref 30.0–36.0)
MCV: 77.3 fL — ABNORMAL LOW (ref 80.0–100.0)
Platelets: 239 10*3/uL (ref 150–400)
RBC: 3.39 MIL/uL — ABNORMAL LOW (ref 3.87–5.11)
RDW: 16.4 % — ABNORMAL HIGH (ref 11.5–15.5)
WBC: 13.7 10*3/uL — ABNORMAL HIGH (ref 4.0–10.5)
nRBC: 0 % (ref 0.0–0.2)

## 2019-11-09 LAB — RAPID URINE DRUG SCREEN, HOSP PERFORMED
Amphetamines: NOT DETECTED
Barbiturates: NOT DETECTED
Benzodiazepines: NOT DETECTED
Cocaine: NOT DETECTED
Opiates: NOT DETECTED
Tetrahydrocannabinol: NOT DETECTED

## 2019-11-09 LAB — GC/CHLAMYDIA PROBE AMP (~~LOC~~) NOT AT ARMC
Chlamydia: POSITIVE — AB
Comment: NEGATIVE
Comment: NORMAL
Neisseria Gonorrhea: NEGATIVE

## 2019-11-09 LAB — TYPE AND SCREEN
ABO/RH(D): A POS
Antibody Screen: NEGATIVE

## 2019-11-09 LAB — GROUP B STREP BY PCR: Group B strep by PCR: POSITIVE — AB

## 2019-11-09 LAB — RPR: RPR Ser Ql: NONREACTIVE

## 2019-11-09 LAB — PROTEIN / CREATININE RATIO, URINE
Creatinine, Urine: 26.33 mg/dL
Protein Creatinine Ratio: 0.3 mg/mg{Cre} — ABNORMAL HIGH (ref 0.00–0.15)
Total Protein, Urine: 8 mg/dL

## 2019-11-09 LAB — SARS CORONAVIRUS 2 BY RT PCR (HOSPITAL ORDER, PERFORMED IN ~~LOC~~ HOSPITAL LAB): SARS Coronavirus 2: NEGATIVE

## 2019-11-09 LAB — ABO/RH: ABO/RH(D): A POS

## 2019-11-09 LAB — POCT FERN TEST: POCT Fern Test: POSITIVE — AB

## 2019-11-09 MED ORDER — WITCH HAZEL-GLYCERIN EX PADS: 1.0000 "application " | MEDICATED_PAD | CUTANEOUS | Status: DC | PRN

## 2019-11-09 MED ORDER — PRENATAL MULTIVITAMIN CH
1.0000 | ORAL_TABLET | Freq: Every day | ORAL | Status: DC
  Filled 2019-11-09: qty 1

## 2019-11-09 MED ORDER — SOD CITRATE-CITRIC ACID 500-334 MG/5ML PO SOLN: 30.0000 mL | ORAL | Status: DC | PRN

## 2019-11-09 MED ORDER — SENNOSIDES-DOCUSATE SODIUM 8.6-50 MG PO TABS
2.0000 | ORAL_TABLET | ORAL | Status: DC
  Administered 2019-11-10: 2 via ORAL
  Filled 2019-11-09 (×2): qty 2

## 2019-11-09 MED ORDER — LACTATED RINGERS IV SOLN
500.0000 mL | Freq: Once | INTRAVENOUS | Status: AC
  Administered 2019-11-09: 500 mL via INTRAVENOUS

## 2019-11-09 MED ORDER — COCONUT OIL OIL: 1.0000 "application " | TOPICAL_OIL | Status: DC | PRN

## 2019-11-09 MED ORDER — SODIUM CHLORIDE 0.9 % IV SOLN
500.0000 mg | INTRAVENOUS | Status: AC
  Administered 2019-11-09 (×2): 500 mg via INTRAVENOUS
  Filled 2019-11-09 (×2): qty 500

## 2019-11-09 MED ORDER — DIPHENHYDRAMINE HCL 25 MG PO CAPS: 25.0000 mg | ORAL_CAPSULE | Freq: Four times a day (QID) | ORAL | Status: DC | PRN

## 2019-11-09 MED ORDER — PHENYLEPHRINE 40 MCG/ML (10ML) SYRINGE FOR IV PUSH (FOR BLOOD PRESSURE SUPPORT): 80.0000 ug | PREFILLED_SYRINGE | INTRAVENOUS | Status: DC | PRN

## 2019-11-09 MED ORDER — EPHEDRINE 5 MG/ML INJ: 10.0000 mg | INTRAVENOUS | Status: DC | PRN

## 2019-11-09 MED ORDER — LEVONORGESTREL 19.5 MCG/DAY IU IUD: INTRAUTERINE_SYSTEM | Freq: Once | INTRAUTERINE | Status: DC

## 2019-11-09 MED ORDER — FENTANYL-BUPIVACAINE-NACL 0.5-0.125-0.9 MG/250ML-% EP SOLN: 12.0000 mL/h | EPIDURAL | Status: DC | PRN

## 2019-11-09 MED ORDER — PHENYLEPHRINE 40 MCG/ML (10ML) SYRINGE FOR IV PUSH (FOR BLOOD PRESSURE SUPPORT)
80.0000 ug | PREFILLED_SYRINGE | INTRAVENOUS | Status: DC | PRN
  Filled 2019-11-09: qty 10

## 2019-11-09 MED ORDER — LIDOCAINE HCL (PF) 1 % IJ SOLN: 30.0000 mL | INTRAMUSCULAR | Status: DC | PRN

## 2019-11-09 MED ORDER — ZOLPIDEM TARTRATE 5 MG PO TABS: 5.0000 mg | ORAL_TABLET | Freq: Every evening | ORAL | Status: DC | PRN

## 2019-11-09 MED ORDER — LACTATED RINGERS IV SOLN: 500.0000 mL | INTRAVENOUS | Status: DC | PRN

## 2019-11-09 MED ORDER — DIPHENHYDRAMINE HCL 50 MG/ML IJ SOLN: 12.5000 mg | INTRAMUSCULAR | Status: DC | PRN

## 2019-11-09 MED ORDER — ONDANSETRON HCL 4 MG/2ML IJ SOLN: 4.0000 mg | Freq: Four times a day (QID) | INTRAMUSCULAR | Status: DC | PRN

## 2019-11-09 MED ORDER — OXYTOCIN-SODIUM CHLORIDE 30-0.9 UT/500ML-% IV SOLN
2.5000 [IU]/h | INTRAVENOUS | Status: DC
  Administered 2019-11-09: 2.5 [IU]/h via INTRAVENOUS
  Filled 2019-11-09: qty 500

## 2019-11-09 MED ORDER — TETANUS-DIPHTH-ACELL PERTUSSIS 5-2.5-18.5 LF-MCG/0.5 IM SUSP: 0.5000 mL | Freq: Once | INTRAMUSCULAR | Status: DC

## 2019-11-09 MED ORDER — IBUPROFEN 600 MG PO TABS
600.0000 mg | ORAL_TABLET | Freq: Four times a day (QID) | ORAL | Status: DC
  Administered 2019-11-09 – 2019-11-10 (×2): 600 mg via ORAL
  Filled 2019-11-09 (×3): qty 1

## 2019-11-09 MED ORDER — OXYTOCIN-SODIUM CHLORIDE 30-0.9 UT/500ML-% IV SOLN
1.0000 m[IU]/min | INTRAVENOUS | Status: DC
  Administered 2019-11-09: 2 m[IU]/min via INTRAVENOUS

## 2019-11-09 MED ORDER — AZITHROMYCIN 250 MG PO TABS
1000.0000 mg | ORAL_TABLET | Freq: Once | ORAL | Status: AC
  Administered 2019-11-09: 1000 mg via ORAL
  Filled 2019-11-09: qty 4

## 2019-11-09 MED ORDER — OXYTOCIN BOLUS FROM INFUSION
333.0000 mL | Freq: Once | INTRAVENOUS | Status: AC
  Administered 2019-11-09: 333 mL via INTRAVENOUS

## 2019-11-09 MED ORDER — ONDANSETRON HCL 4 MG PO TABS: 4.0000 mg | ORAL_TABLET | ORAL | Status: DC | PRN

## 2019-11-09 MED ORDER — DIBUCAINE (PERIANAL) 1 % EX OINT: 1.0000 "application " | TOPICAL_OINTMENT | CUTANEOUS | Status: DC | PRN

## 2019-11-09 MED ORDER — FENTANYL CITRATE (PF) 100 MCG/2ML IJ SOLN: 50.0000 ug | INTRAMUSCULAR | Status: DC | PRN

## 2019-11-09 MED ORDER — LACTATED RINGERS IV SOLN: INTRAVENOUS | Status: DC

## 2019-11-09 MED ORDER — FENTANYL-BUPIVACAINE-NACL 0.5-0.125-0.9 MG/250ML-% EP SOLN
EPIDURAL | Status: AC
  Filled 2019-11-09: qty 250

## 2019-11-09 MED ORDER — SODIUM CHLORIDE (PF) 0.9 % IJ SOLN
INTRAMUSCULAR | Status: DC | PRN
  Administered 2019-11-09: 12 mL/h via EPIDURAL

## 2019-11-09 MED ORDER — ACETAMINOPHEN 325 MG PO TABS: 650.0000 mg | ORAL_TABLET | ORAL | Status: DC | PRN

## 2019-11-09 MED ORDER — SIMETHICONE 80 MG PO CHEW: 80.0000 mg | CHEWABLE_TABLET | ORAL | Status: DC | PRN

## 2019-11-09 MED ORDER — ERYTHROMYCIN 5 MG/GM OP OINT
TOPICAL_OINTMENT | OPHTHALMIC | Status: AC
  Filled 2019-11-09: qty 1

## 2019-11-09 MED ORDER — LIDOCAINE HCL (PF) 1 % IJ SOLN
INTRAMUSCULAR | Status: DC | PRN
  Administered 2019-11-09: 5 mL via EPIDURAL

## 2019-11-09 MED ORDER — BENZOCAINE-MENTHOL 20-0.5 % EX AERO
1.0000 "application " | INHALATION_SPRAY | CUTANEOUS | Status: DC | PRN
  Administered 2019-11-09: 1 via TOPICAL
  Filled 2019-11-09: qty 56

## 2019-11-09 MED ORDER — TERBUTALINE SULFATE 1 MG/ML IJ SOLN: 0.2500 mg | Freq: Once | INTRAMUSCULAR | Status: DC | PRN

## 2019-11-09 MED ORDER — DOCUSATE SODIUM 100 MG PO CAPS
100.0000 mg | ORAL_CAPSULE | Freq: Two times a day (BID) | ORAL | Status: DC
  Filled 2019-11-09 (×3): qty 1

## 2019-11-09 MED ORDER — MEASLES, MUMPS & RUBELLA VAC IJ SOLR: 0.5000 mL | Freq: Once | INTRAMUSCULAR | Status: DC

## 2019-11-09 MED ORDER — METRONIDAZOLE 50 MG/ML ORAL SUSPENSION
2000.0000 mg | Freq: Once | ORAL | Status: AC
  Administered 2019-11-09: 2000 mg via ORAL
  Filled 2019-11-09 (×2): qty 40

## 2019-11-09 MED ORDER — ONDANSETRON HCL 4 MG/2ML IJ SOLN: 4.0000 mg | INTRAMUSCULAR | Status: DC | PRN

## 2019-11-09 MED ORDER — DEXTROSE 5 % IV SOLN: 1000.0000 mg | Freq: Once | INTRAVENOUS | Status: DC

## 2019-11-09 NOTE — Anesthesia Preprocedure Evaluation (Signed)
Anesthesia Evaluation  Patient identified by MRN, date of birth, ID band Patient awake    Reviewed: Allergy & Precautions, Patient's Chart, lab work & pertinent test results  Airway Mallampati: II  TM Distance: >3 FB Neck ROM: Full    Dental no notable dental hx. (+) Teeth Intact   Pulmonary neg pulmonary ROS,    Pulmonary exam normal breath sounds clear to auscultation       Cardiovascular hypertension, Normal cardiovascular exam Rhythm:Regular Rate:Normal     Neuro/Psych negative neurological ROS     GI/Hepatic negative GI ROS, Neg liver ROS,   Endo/Other  negative endocrine ROS  Renal/GU negative Renal ROS     Musculoskeletal   Abdominal   Peds  Hematology  (+) anemia , Lab Results      Component                Value               Date                      WBC                      13.7 (H)            11/09/2019                HGB                      8.3 (L)             11/09/2019                HCT                      26.2 (L)            11/09/2019                MCV                      77.3 (L)            11/09/2019                PLT                      239                 11/09/2019              Anesthesia Other Findings   Reproductive/Obstetrics (+) Pregnancy                             Anesthesia Physical Anesthesia Plan  ASA: III  Anesthesia Plan: Epidural   Post-op Pain Management:    Induction:   PONV Risk Score and Plan:   Airway Management Planned:   Additional Equipment:   Intra-op Plan:   Post-operative Plan:   Informed Consent: I have reviewed the patients History and Physical, chart, labs and discussed the procedure including the risks, benefits and alternatives for the proposed anesthesia with the patient or authorized representative who has indicated his/her understanding and acceptance.       Plan Discussed with:   Anesthesia Plan Comments:  (39.5 wk G3P1 with Anemia (hgb 8.3) for LEA)  Anesthesia Quick Evaluation  

## 2019-11-09 NOTE — H&P (Signed)
LABOR AND DELIVERY ADMISSION HISTORY AND PHYSICAL NOTE  Grace Becker is a 24 y.o. female G47P1011 with IUP at 31w5dby 24wk [place] c/w LMP presenting for SROM.   Reports gush of clear fluid around 0330. Some contractions since then. Denies vaginal bleeding Endorses fetal movement  [facility] with [facility] pooling, cervix checked and found to be 2cm [facility] She plans on bottle feeding. Her contraception plan is: post-placental IUD.  [facility] , [facility], normal anatomy, breech presentation, anterior placenta, 50%ile, EFW 1454 g  Pregnancy complications:  - chlamydia infection - scant PNC  Past Medical History: Past Medical History:  Diagnosis Date   Anxiety    Pregnancy induced hypertension     Past Surgical History: Past Surgical History:  Procedure Laterality Date   NO PAST SURGERIES      Obstetrical History: OB History    Gravida  3   Para  1   Term  1   Preterm      AB  1   Living  1     SAB      TAB      Ectopic      Multiple  0   Live Births  1           Social History: Social History   Socioeconomic History   Marital status: Single    Spouse name: Not on file   Number of children: Not on file   Years of education: Not on file   Highest education level: Not on file  Occupational History   Not on file  Tobacco Use   Smoking status: Never Smoker   Smokeless tobacco: Never Used  Vaping Use   Vaping Use: Never used  Substance and Sexual Activity   Alcohol use: No   Drug use: No   Sexual activity: Yes    Birth control/protection: None  Other Topics Concern   Not on file  Social History Narrative   Not on file   Social Determinants of Health   Financial Resource Strain:    Difficulty of Paying Living Expenses:   Food Insecurity: No Food Insecurity   Worried About Running Out of Food in the Last Year: Never true    Ran Out of Food in the Last Year: Never true  Transportation Needs: No Transportation Needs   Lack of Transportation (Medical): No   Lack of Transportation (Non-Medical): No  Physical Activity:    Days of Exercise per Week:    Minutes of Exercise per Session:   Stress:    Feeling of Stress :   Social Connections:    Frequency of Communication with Friends and Family:    Frequency of Social Gatherings with Friends and Family:    Attends Religious Services:    Active Member of Clubs or Organizations:    Attends CM[employer] therapist    Marital Status:     Family History: Family History  Problem Relation Age of Onset   Hypertension Other    Anxiety disorder Father     Allergies: No Known Allergies  Medications Prior to Admission  Medication Sig Dispense Refill Last Dose   Blood Pressure Monitoring (BLOOD PRESSURE KIT) DEVI 1 Device by Does not apply route as needed. 1 each 0    Prenatal Vit-Fe Fumarate-FA (PRENATAL VITAMINS PO) Take 1 tablet by mouth daily.      sulfamethoxazole-trimethoprim (BACTRIM DS)  800-160 MG tablet Take 1 tablet by mouth 2 (two) times daily. (Patient not taking: Reported on 09/12/2019) 6 tablet 0      Review of Systems  All systems reviewed and negative except as stated in HPI  Physical Exam Blood pressure 120/79, pulse 93, temperature 98.3 F (36.8 C), resp. rate 17, height 5' (1.524 m), weight 63.6 kg, last menstrual period 03/10/2019, SpO2 100 %. General appearance: alert, oriented, NAD Lungs: normal respiratory effort Heart: regular rate Abdomen: soft, non-tender; gravid Extremities: No calf swelling or tenderness Presentation: cephalic by bedside ultrasound  Fetal monitoringBaseline: 145 bpm, Variability: Good {> 6 bpm), Accelerations: Reactive and Decelerations: Early Uterine activity: irregular   Prenatal labs: ABO, Rh: A/Positive/-- (04/16 1217) Antibody: Negative (04/16 1217) Rubella: 1.77 (04/16  1217) RPR: Non Reactive (04/16 1217)  HBsAg: Negative (04/16 1217)  HIV: Non Reactive (04/16 1217)  GC/Chlamydia: neg/POSITIVE 08/25/2019 GBS:   UNKNOWN 2-hr GTT: normal 08/31/2019 Genetic screening:  declined Anatomy US: normal  Prenatal Transfer Tool  Maternal Diabetes: No Genetic Screening: Declined Maternal Ultrasounds/Referrals: Normal Fetal Ultrasounds or other Referrals:  None Maternal Substance Abuse:  No Significant Maternal Medications:  None Significant Maternal Lab Results: None  Results for orders placed or performed during the hospital encounter of 11/09/19 (from the past 24 hour(s))  Fern Test   Collection Time: 11/09/19  5:15 AM  Result Value Ref Range   POCT Fern Test Positive = ruptured amniotic membanes (A)     Patient Active Problem List   Diagnosis Date Noted   Premature rupture of membranes 11/09/2019   Chlamydia infection affecting pregnancy in third trimester, antepartum 08/31/2019   Asymptomatic bacteriuria during pregnancy in third trimester 08/28/2019   Supervision of high risk pregnancy, antepartum 08/23/2019   History of pregnancy induced hypertension 08/23/2019    Assessment: Grace Becker is a 25 y.o. G3P1011 at 14w5dhere for SROM.  #Labor: patient currently in MAU, presentation confirmed with ultrasound. Defer cervical check until reaches the floor, likely pitocin +/- FB. #Pain: IV pain meds PRN, epidural upon request. Plans epidural #FWB: Cat I #GBS/ID: unknown, rapid PCR pending #COVID: swab pending #MOF: Bottle #MOC: post-placental IUD #Circ: yes  #Chlamydia infection: reports she did not receive treatment after diagnosis in April, will give 1g PO Azithromycin  #Scant PNC: UDS, SW consult PP.  #Elevated BP x1: PCollyerlabs ordered  MClarnce Flock7/05/2019, 5:51 AM

## 2019-11-09 NOTE — ED Provider Notes (Signed)
[facility] DEPT Provider Note   CSN: [id number] Arrival date & time: 11/09/19  0335     History Chief Complaint  Patient presents with  . Rupture of Membranes    Grace Becker is a 24 y.o. female.  HPI     This is a 24 year old G3, P19 female at approximately 39 weeks and 5 days pregnant with limited prenatal care who presents with spontaneous rupture of membranes.  Patient reports that she had a gush of fluid around 3:30 AM.  She noted scant bleeding.  She has not had any contractions or abdominal pain.  She reports good fetal movement.  Prior pregnancy complicated by pregnancy-induced hypertension.  During this pregnancy she has been treated for chlamydia.  Chart reviewed.  Estimated date of delivery July 3.  She has an anterior placenta on the most recent available ultrasound.  Past Medical History:  Diagnosis Date  . Anxiety   . Pregnancy induced hypertension     Patient Active Problem List   Diagnosis Date Noted  . Chlamydia infection affecting pregnancy in third trimester, antepartum 08/31/2019  . Asymptomatic bacteriuria during pregnancy in third trimester 08/28/2019  . Supervision of high risk pregnancy, antepartum 08/23/2019  . History of pregnancy induced hypertension 08/23/2019    Past Surgical History:  Procedure Laterality Date  . NO PAST SURGERIES       OB History    Gravida  3   Para  1   Term  1   Preterm      AB  1   Living  1     SAB      TAB      Ectopic      Multiple  0   Live Births  1           Family History  Problem Relation Age of Onset  . Hypertension Other   . Anxiety disorder Father     Social History   Tobacco Use  . Smoking status: Never Smoker  . Smokeless tobacco: Never Used  Vaping Use  . Vaping Use: Never used  Substance Use Topics  . Alcohol use: No  . Drug use: No    Home Medications Prior to Admission medications   Medication Sig Start Date End Date Taking?  Authorizing Provider  Blood Pressure Monitoring (BLOOD PRESSURE KIT) DEVI 1 Device by Does not apply route as needed. 08/23/19   Luvenia Redden, PA-C  Prenatal Vit-Fe Fumarate-FA (PRENATAL VITAMINS PO) Take 1 tablet by mouth daily.    [provider]  sulfamethoxazole-trimethoprim (BACTRIM DS) 800-160 MG tablet Take 1 tablet by mouth 2 (two) times daily. Patient not taking: Reported on 09/12/2019 09/04/19   Aletha Halim, MD    Allergies    Patient has no known allergies.  Review of Systems   Review of Systems  Gastrointestinal: Negative for abdominal pain, nausea and vomiting.  Genitourinary: Negative for dysuria and vaginal pain.       Loss of fluids  All other systems reviewed and are negative.   Physical Exam Updated Vital Signs BP (!) 119/97 (BP Location: Right Arm)   Pulse (!) 107   Temp 98.6 F (37 C) (Oral)   Resp 15   Ht 1.524 m (5')   Wt 63.6 kg   LMP 03/10/2019 (Approximate)   SpO2 100%   BMI 27.38 kg/m   Physical Exam Vitals and nursing note reviewed.  Constitutional:      Appearance: She is well-developed.  HENT:     Head: Normocephalic and atraumatic.     Mouth/Throat:     Mouth: Mucous membranes are moist.  Cardiovascular:     Rate and Rhythm: Normal rate and regular rhythm.  Pulmonary:     Effort: Pulmonary effort is normal. No respiratory distress.  Abdominal:     General: Bowel sounds are normal.     Palpations: Abdomen is soft.     Comments: Gravid abdomen, no tenderness to palpation, fetal movement palpated, no contractions palpated  Genitourinary:    Comments: Cervix: 2 cm / 50% effaced/ -2 Pooling of amniotic fluid No blood Musculoskeletal:     Cervical back: Neck supple.  Skin:    General: Skin is warm and dry.  Neurological:     Mental Status: She is alert and oriented to person, place, and time.  Psychiatric:        Mood and Affect: Mood normal.     ED Results / Procedures / Treatments   Labs (all labs ordered are  listed, but only abnormal results are displayed) Labs Reviewed - No data to display  EKG None  Radiology No results found.  Procedures Procedures (including critical care time)  Medications Ordered in ED Medications - No data to display  ED Course  I have reviewed the triage vital signs and the nursing notes.  Pertinent labs & imaging results that were available during my care of the patient were reviewed by me and considered in my medical decision making (see chart for details).    MDM Rules/Calculators/A&P                           Patient presents with SROM.  She is full-term.  She is not experiencing any contractions.  Cervical exam and  tocometry would not suggest active labor at this time.  Discussed with rapid OB nurse.  Vital signs reviewed.  Initial blood pressure 119/90 which is borderline.  Will hold off on lab work although Covid testing will be sent.  Patient to be transferred to [facility] for expectant management.  Receiving MD, Dr. Elonda Husky.   Final Clinical Impression(s) / ED Diagnoses Final diagnoses:  Full-term premature rupture of membranes, unspecified duration to onset of labor    Rx / DC Orders ED Discharge Orders    None       Sharanda Shinault, Barbette Hair, MD 11/09/19 [phone]

## 2019-11-09 NOTE — ED Notes (Signed)
Spoke with [facility]. In route to [facility] ED ETA 5 minutes

## 2019-11-09 NOTE — ED Notes (Signed)
Rapid [facility] nurse notified.

## 2019-11-09 NOTE — ED Triage Notes (Signed)
Patient arrived stating she is 38 weeks 5 days pregnant with her second child, reports today at 322 her water broke and noticed some bleeding. States her abdomen was tight but not having contractions at this time. No set OBGYN

## 2019-11-09 NOTE — Discharge Summary (Signed)
Postpartum Discharge Summary    Patient Name: Grace Becker DOB: [birth year] MRN: [id number]  Date of admission: 11/09/2019 Delivery date:11/09/2019  Delivering provider: Isaias Sakai  Date of discharge: 11/11/2019  Admitting diagnosis: Full-term premature rupture of membranes, unspecified duration to onset of labor [O42.92] Premature rupture of membranes [O42.90] Intrauterine pregnancy: [redacted]w[redacted]d    Secondary diagnosis:  Principal Problem:   Supervision of high risk pregnancy, antepartum Active Problems:   History of pregnancy induced hypertension   Chlamydia infection affecting pregnancy in third trimester, antepartum   Premature rupture of membranes   Trichomonal vaginitis during pregnancy, antepartum, third trimester   Nexplanon in place  Additional problems: NONE    Discharge diagnosis: Term Pregnancy Delivered                                              Post partum procedures:Nexplanon Augmentation: None Complications: None  Hospital course: Onset of Labor With Vaginal Delivery      24y.o. yo G3P1011 at 378w5das admitted in Active Labor on 11/09/2019. Patient had an uncomplicated labor course as follows: arrived at SROM at 4cm, progressed spontaneously to full dilation. Membrane Rupture Time/Date: 3:22 AM ,11/09/2019   Delivery Method:Vaginal, Vacuum (Extractor)  Episiotomy: None  Lacerations:  2nd degree   Postpartum she was diagnosed with gestational hypertension. Her BP's overall were well controlled and shew as not started on medications.   She received a nexplanon PP.  On admission she was re-tested for chlamydia and found to be positive, reported did not take treatment after diagnosis in April. She received IV azithromycin for treatment and will need TOC at follow up.   At time of discharge she is ambulating, tolerating a regular diet, passing flatus, and urinating well. Patient is discharged home in stable condition on 11/11/19.  Newborn Data: Birth  date:11/09/2019  Birth time:1:06 PM  Gender:Female  Living status:Living  Apgars:9 ,9  Weight:3459 g   Magnesium Sulfate received: No BMZ received: No Rhophylac:N/A MMR:No T-DaP:Given prenatally Flu: N/A Transfusion:No  Physical exam  Vitals:   11/10/19 0641 11/10/19 1510 11/10/19 2216 11/11/19 0537  BP: 108/74 108/73 111/77 115/90  Pulse: 85 83 81 82  Resp: 16 18 18 18   Temp: 98.3 F (36.8 C) 98.6 F (37 C) 98.6 F (37 C) 98.1 F (36.7 C)  TempSrc: Oral Oral Oral Oral  SpO2:  98%  99%  Weight:      Height:       General: alert, cooperative and no distress Lochia: appropriate Uterine Fundus: firm Incision: N/A DVT Evaluation: No evidence of DVT seen on physical exam. No significant calf/ankle edema. Labs: Lab Results  Component Value Date   WBC 13.7 (H) 11/09/2019   HGB 8.3 (L) 11/09/2019   HCT 26.2 (L) 11/09/2019   MCV 77.3 (L) 11/09/2019   PLT 239 11/09/2019   CMP Latest Ref Rng & Units 11/09/2019  Glucose 70 - 99 mg/dL 98  BUN 6 - 20 mg/dL 6  Creatinine 0.44 - 1.00 mg/dL 0.49  Sodium 135 - 145 mmol/L 136  Potassium 3.5 - 5.1 mmol/L 3.8  Chloride 98 - 111 mmol/L 107  CO2 22 - 32 mmol/L 21(L)  Calcium 8.9 - 10.3 mg/dL 9.0  Total Protein 6.5 - 8.1 g/dL 6.5  Total Bilirubin 0.3 - 1.2 mg/dL 0.2(L)  Alkaline Phos 38 - 126 U/L 169(H)  AST  15 - 41 U/L 16  ALT 0 - 44 U/L 11   Edinburgh Score: Edinburgh Postnatal Depression Scale Screening Tool 11/09/2019  I have been able to laugh and see the funny side of things. 0  I have looked forward with enjoyment to things. 0  I have blamed myself unnecessarily when things went wrong. 0  I have been anxious or worried for no good reason. 1  I have felt scared or panicky for no good reason. 2  Things have been getting on top of me. 0  I have been so unhappy that I have had difficulty sleeping. 0  I have felt sad or miserable. 0  I have been so unhappy that I have been crying. 0  The thought of harming myself has  occurred to me. 0  Edinburgh Postnatal Depression Scale Total 3     After visit meds:  Allergies as of 11/11/2019   No Known Allergies     Medication List    STOP taking these medications   sulfamethoxazole-trimethoprim 800-160 MG tablet Commonly known as: BACTRIM DS     TAKE these medications   acetaminophen 325 MG tablet Commonly known as: Tylenol Take 2 tablets (650 mg total) by mouth every 4 (four) hours as needed (for pain scale < 4).   Blood Pressure Kit Devi 1 Device by Does not apply route as needed.   ibuprofen 600 MG tablet Commonly known as: ADVIL Take 1 tablet (600 mg total) by mouth every 6 (six) hours as needed.   polyethylene glycol powder 17 GM/SCOOP powder Commonly known as: GLYCOLAX/MIRALAX Take 17 g by mouth daily as needed.   PRENATAL GUMMIES/DHA & FA PO Take 2 tablets by mouth daily.        Discharge home in stable condition Infant Feeding: Bottle Infant Disposition:home with mother Discharge instruction: per After Visit Summary and Postpartum booklet. Activity: Advance as tolerated. Pelvic rest for 6 weeks.  Diet: routine diet Future Appointments: No future appointments. Follow up Visit:   Please schedule this patient for a In person postpartum visit in 4 weeks with the following provider: Any provider. Additional Postpartum F/U:BP check 1 week and TOC for chlamydia  High risk pregnancy complicated by: Limited PNC and late to [facility]; Chlamydia and trichomonas in pregnancy / during delivery (treated in labor) Delivery mode:  Vaginal, Vacuum Neurosurgeon)  Anticipated Birth Control:  Nexplanon   11/11/2019 Clarnce Flock, MD

## 2019-11-09 NOTE — Progress Notes (Signed)
Grace Becker is a 24 y.o. G3P1011 at [redacted]w[redacted]d admitted for PROM.  Subjective: Patient recently beginning to feel very strong regular contractions. Continues to leak fluid. Discussed labor plans and treatment for infections, patient agreement with plan.  Objective: BP 114/76    Pulse (!) 109    Temp 98.7 F (37.1 C) (Oral)    Resp 18    Ht 5' (1.524 m)    Wt 63.6 kg    LMP 03/10/2019 (Approximate)    SpO2 100%    BMI 27.38 kg/m  No intake/output data recorded.  FHT:  FHR: 120 bpm, variability: moderate,  accelerations:  Present,  decelerations:  Absent UC:   regular, every 2 minutes  SVE:   Dilation: 4 Effacement (%): 70 Station: -3 Exam by:: Dr. Omer Jack   Labs: Lab Results  Component Value Date   WBC 13.7 (H) 11/09/2019   HGB 8.3 (L) 11/09/2019   HCT 26.2 (L) 11/09/2019   MCV 77.3 (L) 11/09/2019   PLT 239 11/09/2019    Assessment / Plan: Spontaneous labor, progressing normally  Labor: Progressing normally Fetal Wellbeing:  Category I Pain Control:  Epidural Pre-eclampsia: Does not meet requirements 2/2 single DBP 97, no other elevated BP. To note, baseline PC ratio today is 0.3, if another elevated BP occurs, may meet criteria for preE. I/D:  GBS unknown, PCR pending, given term, will not treat at this time. Chlamydia: Did not tolerate PO Azithro, immediately threw up, therefore giving via IV Trichomonas: Patient will be given PO suspension Flagyl, with Zofran for treatment Anticipated MOD:  NSVD MOC: Plan for Nexplanon now 2/2 current chlamydial infection PNC: Scant PNC and late to care, SW to be consulted PP, UDS pending  Jen Mow, DO OB Faculty Practice 11/09/2019, 10:26 AM

## 2019-11-09 NOTE — ED Notes (Signed)
Called [facility]@04 :15am to transport pt to MAU.

## 2019-11-09 NOTE — MAU Note (Signed)
Sent over from [facility] for SROM 0322-clear fluid.  Denies CTX/VB.  + FM.  PNC at [employer].  GBS unknown.  Reports previous pre-e in last pregnancy.

## 2019-11-09 NOTE — Anesthesia Procedure Notes (Addendum)
Epidural Patient location during procedure: OB Start time: 11/09/2019 9:38 AM End time: 11/09/2019 9:51 AM  Staffing Anesthesiologist: Trevor Iha, MD Performed: anesthesiologist   Preanesthetic Checklist Completed: patient identified, IV checked, site marked, risks and benefits discussed, surgical consent, monitors and equipment checked, pre-op evaluation and timeout performed  Epidural Patient position: sitting Prep: DuraPrep and site prepped and draped Patient monitoring: continuous pulse ox and blood pressure Approach: midline Location: L2-L3 Injection technique: LOR air  Needle:  Needle type: Tuohy  Needle gauge: 17 G Needle length: 9 cm and 9 Needle insertion depth: 5 cm cm Catheter type: closed end flexible Catheter size: 19 Gauge Catheter at skin depth: 11 cm Test dose: negative  Assessment Events: blood not aspirated, injection not painful, no injection resistance, no paresthesia and negative IV test  Additional Notes Patient identified. Risks/Benefits/Options discussed with patient including but not limited to bleeding, infection, nerve damage, paralysis, failed block, incomplete pain control, headache, blood pressure changes, nausea, vomiting, reactions to medication both or allergic, itching and postpartum back pain. Confirmed with bedside nurse the patient's most recent platelet count. Confirmed with patient that they are not currently taking any anticoagulation, have any bleeding history or any family history of bleeding disorders. Patient expressed understanding and wished to proceed. All questions were answered. Sterile technique was used throughout the entire procedure. Please see nursing notes for vital signs. Test dose was given through epidural needle and negative prior to continuing to dose epidural or start infusion. Warning signs of high block given to the patient including shortness of breath, tingling/numbness in hands, complete motor block, or any  concerning symptoms with instructions to call for help. Patient was given instructions on fall risk and not to get out of bed. All questions and concerns addressed with instructions to call with any issues. 1 Attempt (S) . Patient tolerated procedure well.

## 2019-11-10 DIAGNOSIS — Z30017 Encounter for initial prescription of implantable subdermal contraceptive: Secondary | ICD-10-CM

## 2019-11-10 MED ORDER — LIDOCAINE HCL 1 % IJ SOLN
0.0000 mL | Freq: Once | INTRAMUSCULAR | Status: AC | PRN
  Administered 2019-11-10: 20 mL via INTRADERMAL
  Filled 2019-11-10: qty 20

## 2019-11-10 MED ORDER — IBUPROFEN 100 MG/5ML PO SUSP
600.0000 mg | Freq: Four times a day (QID) | ORAL | Status: DC
  Administered 2019-11-10 – 2019-11-11 (×5): 600 mg via ORAL
  Filled 2019-11-10 (×5): qty 30

## 2019-11-10 MED ORDER — ETONOGESTREL 68 MG ~~LOC~~ IMPL
68.0000 mg | DRUG_IMPLANT | Freq: Once | SUBCUTANEOUS | Status: AC
  Administered 2019-11-10: 68 mg via SUBCUTANEOUS
  Filled 2019-11-10: qty 1

## 2019-11-10 MED ORDER — SODIUM CHLORIDE 0.9 % IV SOLN
510.0000 mg | Freq: Once | INTRAVENOUS | Status: AC
  Administered 2019-11-10: 510 mg via INTRAVENOUS
  Filled 2019-11-10: qty 17

## 2019-11-10 NOTE — Progress Notes (Signed)
POSTPARTUM PROGRESS NOTE  Post Partum Day 1  Subjective:  Grace Becker is a 24 y.o. G3P1011 s/p VAVD at [redacted]w[redacted]d.  She reports she is doing well. No acute events overnight. She denies any problems with ambulating, voiding or po intake. Denies nausea or vomiting.  Pain is well controlled.  Lochia is less than a period.  Denies HA, vision changes, chest pain, SOB, RUQ pain, LE edema.  Objective: Blood pressure 108/74, pulse 85, temperature 98.3 F (36.8 C), temperature source Oral, resp. rate 16, height 5' (1.524 m), weight 63.6 kg, last menstrual period 03/10/2019, SpO2 98 %.  Physical Exam:  General: alert, cooperative and no distress Chest: no respiratory distress Heart:regular rate, distal pulses intact Abdomen: soft, nontender,  Uterine Fundus: firm, appropriately tender DVT Evaluation: No calf swelling or tenderness Extremities: no LE edema Skin: warm, dry  Recent Labs    11/09/19 0542  HGB 8.3*  HCT 26.2*    Assessment/Plan: Grace Becker is a 24 y.o. G3P1011 s/p VAVD at [redacted]w[redacted]d   PPD#1 - Doing well  Routine postpartum care GHTN: multiple mild range BP's but currently normotensive and asymptomatic. P:C just barely elevated at 0.3 but taken after SROM and unclear if from straight cath. More c/w gHTN rather than PreE but will watch closely, send message to clinic for BP check.  Chlamydia: positive test on admission, given 1g PO Azithro but vomited it shortly after admnistration. Subsequently given IV Azithro 1g, will need TOC in 3-4 weeks.  Trich: s/p IV flagyl tx Anemia: hgb 8.3 on admission, will give IV iron Contraception: Nexplanon to be placed Feeding: Bottle Dispo: Plan for discharge PPD#2 (infant unlikely to d/c today).   LOS: 1 day   Zack Seal, MD/[facility] OB Fellow  11/10/2019, 7:29 AM

## 2019-11-10 NOTE — Clinical Social Work Maternal (Signed)
CLINICAL SOCIAL WORK MATERNAL/CHILD NOTE  Patient Details  Name: Grace Becker MRN: [id number] Date of Birth: 11/05/1995  Date:  02/28/20  Clinical Social Worker Initiating Note:  Hortencia Pilar, LCSW Date/Time: Initiated:  11/10/19/0925     Child's Name:  Grace Becker   Biological Parents:  Mother, Father Grace Becker [facility])   Need for Interpreter:  None   Reason for Referral:  Late or No Prenatal Care    Address:  [address] [place] [address]    Phone number:  [phone] (home)     Additional phone number: none   Household Members/Support Persons (HM/SP):   Household Member/Support Person 1   HM/SP Name Relationship DOB or Age  HM/SP -1 Grace Becker  daughter   09/11/2016  HM/SP -2  Grace Becker   [facility]   [birth year]1995  HM/SP -3        HM/SP -4        HM/SP -5        HM/SP -6        HM/SP -7        HM/SP -8          Natural Supports (not living in the home):  Parent   Professional Supports: None   Employment: Full-time   Type of Work: Administrator, arts   Education:  High school graduate   Homebound arranged:  n/a  Surveyor, quantity Resources:  Medicaid ([employer])   Other Resources:  Sales executive , [facility]   Cultural/Religious Considerations Which May Impact Care:  none reported.   Strengths:  Ability to meet basic needs , Compliance with medical plan , Home prepared for child , Pediatrician chosen   Psychotropic Medications:     None at this time.   Pediatrician:    [place]  Pediatrician List:   [facility]  [facility]      Pediatrician Fax Number:    Risk Factors/Current Problems:  None   Cognitive State:  Able to Concentrate , Insightful , Alert    Mood/Affect:  Relaxed , Comfortable , Calm , Happy , Interested    CSW Assessment: CSW consulted as MOB received care after 28 weeks. CSW  went to speak with MOB at bedside to address further needs.   CSW congratulated MOB on the birth of infant. CSW advised MOB of CSW's role as well as the HIPPA policy, in which MOB and FOB were agreeable to having FOB leave room. CSW then updated MOB of the reason for CSW coming to speak with her. MOB reported that she wasn't aware that she was pregnant. MOB reported that she didn't appear to be growing and reported "I had a cycle every month". CSW understanding of this but also advised MOB of the hospital drug screen policy. MOB was advised that if infants CDS or UDS is positive then CSW would need to make a CPS report. MOB reported no substance use during this pregnancy and reported that she understood the policy and why infant was being screened. MOB also reported no current or previous CPS hx.   CSW inquired from MOB on her mental health hx. MOB reported that she was diagnosed with anxiety in 2018 after the brth of her daughter. MOB reported that she also thinks that's he dealt with PPD as MOB reported feeling down and a lot of crying after  that birth. MOB reported that she was given medications but never took them. MOB also reported that she was seeing a therapist, however MOB reports that she is no longer seeing therapist, MOB was open to resources for therapy and reported a desire to follow up with therapist as needed. MOB denies SI and HI and reported no DV at this time.   CSW took time to provide MOB with PPD and SIDS education. MOB was given PPD Checklist to keep track of feelings as they relate to PPD. MOB reported that she has all needed items to care for infant and reports that infant will be seen at [facility] for follow up care. MOB reported that infant will sleep in baby bed once arrived home. MOB also informed CSW that she gets WIC and Food Stamps. MOB expressed no other needs to this CSW.  CSW will continue to follow infants CDS and UDS and make CPS report if warranted.   CSW  Plan/Description:  Sudden Infant Death Syndrome (SIDS) Education, Perinatal Mood and Anxiety Disorder (PMADs) Education, CSW Will Continue to Monitor Umbilical Cord Tissue Drug Screen Results and Make Report if Warranted, Hospital Drug Screen Policy Information    Tiann Saha S Aayana Reinertsen, LCSWA 11/10/2019, 10:00 AM 

## 2019-11-10 NOTE — Anesthesia Postprocedure Evaluation (Signed)
Anesthesia Post Note  Patient: Grace Becker  Procedure(s) Performed: AN AD HOC LABOR EPIDURAL     Patient location during evaluation: Mother Baby Anesthesia Type: Epidural Level of consciousness: awake and alert and oriented Pain management: satisfactory to patient Vital Signs Assessment: post-procedure vital signs reviewed and stable Respiratory status: respiratory function stable Cardiovascular status: stable Postop Assessment: no headache, no backache, epidural receding, patient able to bend at knees, no signs of nausea or vomiting, adequate PO intake and able to ambulate Anesthetic complications: no   No complications documented.  Last Vitals:  Vitals:   11/10/19 0252 11/10/19 0641  BP: 112/81 108/74  Pulse: 88 85  Resp: 16 16  Temp: 37.2 C 36.8 C  SpO2:      Last Pain:  Vitals:   11/10/19 0641  TempSrc: Oral  PainSc: 0-No pain   Pain Goal:                   Dylon Correa

## 2019-11-10 NOTE — Procedures (Signed)
Post-Placental Nexplanon Insertion Procedure Note  Patient was identified. Informed consent was signed, signed copy in chart. A time-out was performed.    The insertion site was identified 8-10 cm (3-4 inches) from the medial epicondyle of the humerus and 3-5 cm (1.25-2 inches) posterior to (below) the sulcus (groove) between the biceps and triceps muscles of the patient's left arm and marked. The site was prepped and draped in the usual sterile fashion. Pt was prepped with alcohol swab and then injected with 3 cc of 1% lidocaine. The site was prepped with betadine. Nexplanon removed form packaging,  Device confirmed in needle, then inserted full length of needle and withdrawn per handbook instructions. Provider and patient verified presence of the implant in the woman's arm by palpation. Pt insertion site was covered with steristrips/adhesive bandage and pressure bandage. There was minimal blood loss. Patient tolerated procedure well.  Patient was given post procedure instructions and Nexplanon user card with expiration date. Condoms were recommended for STI prevention. Patient was asked to keep the pressure dressing on for 24 hours to minimize bruising and keep the adhesive bandage on for 3-5 days. The patient verbalized understanding of the plan of care and agrees.   Lot # [id number] Expiration Date 01/09/2022  Marlowe Alt, DO OB Fellow, Faculty Practice 11/10/2019 11:55 AM

## 2019-11-11 DIAGNOSIS — O139 Gestational [pregnancy-induced] hypertension without significant proteinuria, unspecified trimester: Secondary | ICD-10-CM

## 2019-11-11 DIAGNOSIS — Z975 Presence of (intrauterine) contraceptive device: Secondary | ICD-10-CM

## 2019-11-11 MED ORDER — POLYETHYLENE GLYCOL 3350 17 GM/SCOOP PO POWD
17.0000 g | Freq: Every day | ORAL | 1 refills | Status: DC | PRN
Start: 2019-11-11 — End: 2022-12-24

## 2019-11-11 MED ORDER — ACETAMINOPHEN 325 MG PO TABS: 650.0000 mg | ORAL_TABLET | ORAL | 0 refills | Status: DC | PRN

## 2019-11-11 MED ORDER — IBUPROFEN 600 MG PO TABS
600.0000 mg | ORAL_TABLET | Freq: Four times a day (QID) | ORAL | 0 refills | Status: DC | PRN
Start: 2019-11-11 — End: 2022-12-24

## 2019-11-11 NOTE — Progress Notes (Signed)
AVS printed and discharged instructions given to pt. Pt instructed to call for follow up appointment. All questions answered and pt has no further questions.

## 2019-11-11 NOTE — Discharge Instructions (Signed)

## 2019-11-16 ENCOUNTER — Encounter: Payer: Medicaid Other | Admitting: Obstetrics and Gynecology

## 2019-11-22 ENCOUNTER — Ambulatory Visit: Payer: Medicaid Other

## 2019-11-24 ENCOUNTER — Encounter (HOSPITAL_COMMUNITY): Payer: Self-pay | Admitting: Obstetrics and Gynecology

## 2019-11-24 ENCOUNTER — Observation Stay (HOSPITAL_COMMUNITY)
Admission: AD | Admit: 2019-11-24 | Discharge: 2019-11-26 | DRG: 776 | Disposition: A | Discharge status: Home / Self Care | Payer: Medicaid Other | Attending: Obstetrics & Gynecology | Admitting: Obstetrics & Gynecology

## 2019-11-24 ENCOUNTER — Other Ambulatory Visit: Payer: Self-pay

## 2019-11-24 DIAGNOSIS — O1495 Unspecified pre-eclampsia, complicating the puerperium: Secondary | ICD-10-CM | POA: Diagnosis present

## 2019-11-24 DIAGNOSIS — Z20822 Contact with and (suspected) exposure to covid-19: Secondary | ICD-10-CM | POA: Diagnosis present

## 2019-11-24 HISTORY — DX: Unspecified pre-eclampsia, complicating the puerperium: O14.95

## 2019-11-24 LAB — CBC
HCT: 35 % — ABNORMAL LOW (ref 36.0–46.0)
Hemoglobin: 10.9 g/dL — ABNORMAL LOW (ref 12.0–15.0)
MCH: 25.2 pg — ABNORMAL LOW (ref 26.0–34.0)
MCHC: 31.1 g/dL (ref 30.0–36.0)
MCV: 81 fL (ref 80.0–100.0)
Platelets: 366 10*3/uL (ref 150–400)
RBC: 4.32 MIL/uL (ref 3.87–5.11)
RDW: 21.2 % — ABNORMAL HIGH (ref 11.5–15.5)
WBC: 4.6 10*3/uL (ref 4.0–10.5)
nRBC: 0 % (ref 0.0–0.2)

## 2019-11-24 LAB — COMPREHENSIVE METABOLIC PANEL
ALT: 19 U/L (ref 0–44)
AST: 17 U/L (ref 15–41)
Albumin: 3.5 g/dL (ref 3.5–5.0)
Alkaline Phosphatase: 106 U/L (ref 38–126)
Anion gap: 9 (ref 5–15)
BUN: 11 mg/dL (ref 6–20)
CO2: 21 mmol/L — ABNORMAL LOW (ref 22–32)
Calcium: 8.9 mg/dL (ref 8.9–10.3)
Chloride: 112 mmol/L — ABNORMAL HIGH (ref 98–111)
Creatinine, Ser: 0.86 mg/dL (ref 0.44–1.00)
GFR calc Af Amer: >60 mL/min (ref >60)
GFR calc non Af Amer: >60 mL/min (ref >60)
Glucose, Bld: 103 mg/dL — ABNORMAL HIGH (ref 70–99)
Potassium: 3.7 mmol/L (ref 3.5–5.1)
Sodium: 142 mmol/L (ref 135–145)
Total Bilirubin: 0.5 mg/dL (ref 0.3–1.2)
Total Protein: 6.8 g/dL (ref 6.5–8.1)

## 2019-11-24 LAB — URINALYSIS, ROUTINE W REFLEX MICROSCOPIC
Bilirubin Urine: NEGATIVE
Glucose, UA: NEGATIVE mg/dL
Ketones, ur: NEGATIVE mg/dL
Nitrite: NEGATIVE
Protein, ur: NEGATIVE mg/dL
Specific Gravity, Urine: 1.012 (ref 1.005–1.030)
pH: 6 (ref 5.0–8.0)

## 2019-11-24 LAB — TYPE AND SCREEN
ABO/RH(D): A POS
Antibody Screen: NEGATIVE

## 2019-11-24 LAB — SARS CORONAVIRUS 2 BY RT PCR (HOSPITAL ORDER, PERFORMED IN ~~LOC~~ HOSPITAL LAB): SARS Coronavirus 2: NEGATIVE

## 2019-11-24 MED ORDER — CALCIUM CARBONATE ANTACID 500 MG PO CHEW: 2.0000 | CHEWABLE_TABLET | ORAL | Status: DC | PRN

## 2019-11-24 MED ORDER — HYDRALAZINE HCL 20 MG/ML IJ SOLN: 10.0000 mg | INTRAMUSCULAR | Status: DC | PRN

## 2019-11-24 MED ORDER — LABETALOL HCL 5 MG/ML IV SOLN
20.0000 mg | INTRAVENOUS | Status: DC | PRN
  Administered 2019-11-24: 20 mg via INTRAVENOUS
  Filled 2019-11-24: qty 4

## 2019-11-24 MED ORDER — MAGNESIUM SULFATE BOLUS VIA INFUSION
4.0000 g | Freq: Once | INTRAVENOUS | Status: AC
  Administered 2019-11-24: 4 g via INTRAVENOUS
  Filled 2019-11-24: qty 1000

## 2019-11-24 MED ORDER — DOCUSATE SODIUM 100 MG PO CAPS
100.0000 mg | ORAL_CAPSULE | Freq: Every day | ORAL | Status: DC
  Filled 2019-11-24: qty 1

## 2019-11-24 MED ORDER — MAGNESIUM SULFATE 40 GM/1000ML IV SOLN
2.0000 g/h | INTRAVENOUS | Status: AC
  Administered 2019-11-25: 2 g/h via INTRAVENOUS
  Filled 2019-11-24 (×2): qty 1000

## 2019-11-24 MED ORDER — ACETAMINOPHEN 325 MG PO TABS: 650.0000 mg | ORAL_TABLET | ORAL | Status: DC | PRN

## 2019-11-24 MED ORDER — LABETALOL HCL 5 MG/ML IV SOLN
80.0000 mg | INTRAVENOUS | Status: DC | PRN
  Administered 2019-11-24: 80 mg via INTRAVENOUS
  Filled 2019-11-24: qty 16

## 2019-11-24 MED ORDER — ZOLPIDEM TARTRATE 5 MG PO TABS: 5.0000 mg | ORAL_TABLET | Freq: Every evening | ORAL | Status: DC | PRN

## 2019-11-24 MED ORDER — NIFEDIPINE 10 MG PO CAPS
10.0000 mg | ORAL_CAPSULE | Freq: Once | ORAL | Status: AC
  Administered 2019-11-24: 10 mg via ORAL
  Filled 2019-11-24 (×2): qty 1

## 2019-11-24 MED ORDER — LACTATED RINGERS IV SOLN: INTRAVENOUS | Status: DC

## 2019-11-24 MED ORDER — PRENATAL MULTIVITAMIN CH
1.0000 | ORAL_TABLET | Freq: Every day | ORAL | Status: DC
  Filled 2019-11-24: qty 1

## 2019-11-24 MED ORDER — LABETALOL HCL 5 MG/ML IV SOLN
40.0000 mg | INTRAVENOUS | Status: DC | PRN
  Administered 2019-11-24: 40 mg via INTRAVENOUS
  Filled 2019-11-24: qty 8

## 2019-11-24 NOTE — Telephone Encounter (Signed)
Babyscripts called with elevated pressure.  168/106.  Called patient.  No symptoms.  Pt is post partum.  Retook BP and still elevated.  Pt advised to come to MAU for evaluation.  Pt understands risk for stroke and death if not addressed.  Pt will have her mother bring her to MAU.    Grace Lincoln, MD

## 2019-11-24 NOTE — MAU Note (Signed)
Pt presents to MAU due to elevated blood pressures at home. She states that she was "feeling off" today, she checked her BP at home 160s/100s, called office and they advised her to come to MAU for eval. She denies HA, visual changes, epigastric pain and swelling. She delivered vaginally on 11/09/2019, no problems in pregnancy with BP.

## 2019-11-24 NOTE — H&P (Signed)
FACULTY PRACTICE ANTEPARTUM ADMISSION HISTORY AND PHYSICAL NOTE   History of Present Illness: Grace Becker is a 24 y.o. G3P2012 at 2 weeks post partum who presents for post partum preeclampsia. She had a term vaginal delivery on 7/1.  Patient has new onset severe range blood pressures today. Has history of pp preeclampsia with her last pregnancy. Denies headache, visual disturbance, or epigastric pain. Severe range BPs in MAU with preeclampsia labs pending. IV antihypertensive protocol ordered - difficult IV stick after 3 attempts oral procardia given.   Patient Active Problem List   Diagnosis Date Noted  . Preeclampsia in postpartum period 11/24/2019  . Nexplanon in place 11/11/2019  . Gestational hypertension 11/11/2019  . Premature rupture of membranes 11/09/2019  . Trichomonal vaginitis during pregnancy, antepartum, third trimester 11/09/2019  . Chlamydia infection affecting pregnancy in third trimester, antepartum 08/31/2019  . Asymptomatic bacteriuria during pregnancy in third trimester 08/28/2019  . Supervision of high risk pregnancy, antepartum 08/23/2019  . History of pregnancy induced hypertension 08/23/2019    Past Medical History:  Diagnosis Date  . Anxiety   . Pregnancy induced hypertension     Past Surgical History:  Procedure Laterality Date  . NO PAST SURGERIES      OB History  Gravida Para Term Preterm AB Living  3 2 2   1 2   SAB TAB Ectopic Multiple Live Births        0 2    # Outcome Date GA Lbr Len/2nd Weight Sex Delivery Anes PTL Lv  3 Term 11/09/19 [redacted]w[redacted]d   Vag-Spont   LIV  2 AB 2019          1 Term 09/11/16 451w3d 06:39 3481 g F Vag-Vacuum EPI  LIV     Birth Comments: pp PIH    Social History   Socioeconomic History  . Marital status: Single    Spouse name: Not on file  . Number of children: Not on file  . Years of education: Not on file  . Highest education level: Not on file  Occupational History  . Not on file  Tobacco Use  .  Smoking status: Never Smoker  . Smokeless tobacco: Never Used  Vaping Use  . Vaping Use: Never used  Substance and Sexual Activity  . Alcohol use: No  . Drug use: No  . Sexual activity: Yes    Birth control/protection: None  Other Topics Concern  . Not on file  Social History Narrative  . Not on file   Social Determinants of Health   Financial Resource Strain:   . Difficulty of Paying Living Expenses:   Food Insecurity: No Food Insecurity  . Worried About RuCharity fundraisern the Last Year: Never true  . Ran Out of Food in the Last Year: Never true  Transportation Needs: No Transportation Needs  . Lack of Transportation (Medical): No  . Lack of Transportation (Non-Medical): No  Physical Activity:   . Days of Exercise per Week:   . Minutes of Exercise per Session:   Stress:   . Feeling of Stress :   Social Connections:   . Frequency of Communication with Friends and Family:   . Frequency of Social Gatherings with Friends and Family:   . Attends Religious Services:   . Active Member of Clubs or Organizations:   . Attends ClArchivisteetings:   . Marland Kitchenarital Status:     Family History  Problem Relation Age of Onset  . Hypertension Other   .  Anxiety disorder Father     No Known Allergies  Medications Prior to Admission  Medication Sig Dispense Refill Last Dose  . acetaminophen (TYLENOL) 325 MG tablet Take 2 tablets (650 mg total) by mouth every 4 (four) hours as needed (for pain scale < 4). 30 tablet 0 Past Week at Unknown time  . Blood Pressure Monitoring (BLOOD PRESSURE KIT) DEVI 1 Device by Does not apply route as needed. 1 each 0 Past Week at Unknown time  . ibuprofen (ADVIL) 600 MG tablet Take 1 tablet (600 mg total) by mouth every 6 (six) hours as needed. 30 tablet 0 Past Week at Unknown time  . polyethylene glycol powder (GLYCOLAX/MIRALAX) 17 GM/SCOOP powder Take 17 g by mouth daily as needed. 510 g 1 Past Week at Unknown time  . Prenatal  MV-Min-FA-Omega-3 (PRENATAL GUMMIES/DHA & FA PO) Take 2 tablets by mouth daily.   Past Week at Unknown time    Review of Systems - History obtained from the patient General ROS: negative for - fever Respiratory ROS: negative for - shortness of breath Cardiovascular ROS: negative for - chest pain Neurological ROS: negative for - headaches  Vitals:  BP (!) 189/100   Pulse 63   Temp 98.1 F (36.7 C) (Oral)   Resp 17   Ht 5' (1.524 m)   Wt 55.8 kg   SpO2 100%   Breastfeeding Unknown   BMI 24.02 kg/m    Patient Vitals for the past 24 hrs:  BP Temp Temp src Pulse Resp SpO2 Height Weight  11/24/19 1703 (!) 189/100 -- -- 63 -- -- -- --  11/24/19 1646 (!) 170/114 -- -- 72 -- -- -- --  11/24/19 1641 (!) 167/100 -- -- 69 17 100 % -- --  11/24/19 1624 (!) 164/108 98.1 F (36.7 C) Oral 71 20 100 % 5' (1.524 m) 55.8 kg    Physical Examination: CONSTITUTIONAL: Well-developed, well-nourished female in no acute distress.  HENT:  Normocephalic, atraumatic, External right and left ear normal. Oropharynx is clear and moist EYES: Conjunctivae and EOM are normal. Pupils are equal, round, and reactive to light. No scleral icterus.  SKIN: Skin is warm and dry. No rash noted. Not diaphoretic. No erythema. No pallor. Callender: patellar DTR 2+. No clonus.  PSYCHIATRIC: Normal mood and affect. Normal behavior. Normal judgment and thought content. CARDIOVASCULAR: Normal heart rate noted, regular rhythm RESPIRATORY: Effort and breath sounds normal, no problems with respiration noted ABDOMEN: Soft, nontender, nondistended, gravid. MUSCULOSKELETAL: Normal range of motion. No edema and no tenderness. 2+ distal pulses.   Labs:  No results found for this or any previous visit (from the past 24 hour(s)).  Imaging Studies: No results found.   Assessment and Plan: 1. Preeclampsia in postpartum period   -admit to [facility] unit -start mag sulfate -antihypertensive protocol to continue -preeclampsia labs  pending  Jorje Guild, NP 11/24/2019 5:07 PM

## 2019-11-25 MED ORDER — NIFEDIPINE ER OSMOTIC RELEASE 30 MG PO TB24
30.0000 mg | ORAL_TABLET | Freq: Every day | ORAL | Status: DC
  Administered 2019-11-25: 30 mg via ORAL
  Filled 2019-11-25: qty 1

## 2019-11-25 MED ORDER — NIFEDIPINE ER OSMOTIC RELEASE 30 MG PO TB24: 30.0000 mg | ORAL_TABLET | Freq: Every day | ORAL | Status: DC

## 2019-11-25 NOTE — Progress Notes (Signed)
Post Partum Day 16 Subjective: no complaints and tolerating PO No headache or CNS symptoms Objective: Blood pressure 119/84, pulse 88, temperature 98 F (36.7 C), temperature source Oral, resp. rate 15, height 5' (1.524 m), weight 55.8 kg, SpO2 100 %, unknown if currently breastfeeding.  Physical Exam:  General: alert, cooperative and no distress Lochia: appropriate Uterine Fundus: firm Incision:  DVT Evaluation: No evidence of DVT seen on physical exam.  Recent Labs    11/24/19 1735  HGB 10.9*  HCT 35.0*    Assessment/Plan: Magnesium off 1700 Begin procardia XL 30 at 2200 Anticipate discharge tomorrow   LOS: 0 days   Lazaro Arms 11/25/2019, 7:31 AM

## 2019-11-26 MED ORDER — NIFEDIPINE ER OSMOTIC RELEASE 30 MG PO TB24
30.0000 mg | ORAL_TABLET | Freq: Two times a day (BID) | ORAL | Status: DC
  Administered 2019-11-26: 30 mg via ORAL
  Filled 2019-11-26: qty 1

## 2019-11-26 MED ORDER — NIFEDIPINE ER 30 MG PO TB24: 30.0000 mg | ORAL_TABLET | Freq: Two times a day (BID) | ORAL | 2 refills | Status: DC

## 2019-11-26 NOTE — Discharge Instructions (Signed)
Preeclampsia and Eclampsia Preeclampsia is a serious condition that may develop during pregnancy. This condition causes high blood pressure and increased protein in your urine along with other symptoms, such as headaches and vision changes. These symptoms may develop as the condition gets worse. Preeclampsia may occur at 20 weeks of pregnancy or later. Diagnosing and treating preeclampsia early is very important. If not treated early, it can cause serious problems for you and your baby. One problem it can lead to is eclampsia. Eclampsia is a condition that causes muscle jerking or shaking (convulsions or seizures) and other serious problems for the mother. During pregnancy, delivering your baby may be the best treatment for preeclampsia or eclampsia. For most women, preeclampsia and eclampsia symptoms go away after giving birth. In rare cases, a woman may develop preeclampsia after giving birth (postpartum preeclampsia). This usually occurs within 48 hours after childbirth but may occur up to 6 weeks after giving birth. What are the causes? The cause of preeclampsia is not known. What increases the risk? The following risk factors make you more likely to develop preeclampsia:  Being pregnant for the first time.  Having had preeclampsia during a past pregnancy.  Having a family history of preeclampsia.  Having high blood pressure.  Being pregnant with more than one baby.  Being 35 or older.  Being African-American.  Having kidney disease or diabetes.  Having medical conditions such as lupus or blood diseases.  Being very overweight (obese). What are the signs or symptoms? The most common symptoms are:  Severe headaches.  Vision problems, such as blurred or double vision.  Abdominal pain, especially upper abdominal pain. Other symptoms that may develop as the condition gets worse include:  Sudden weight gain.  Sudden swelling of the hands, face, legs, and feet.  Severe nausea  and vomiting.  Numbness in the face, arms, legs, and feet.  Dizziness.  Urinating less than usual.  Slurred speech.  Convulsions or seizures. How is this diagnosed? There are no screening tests for preeclampsia. Your health care provider will ask you about symptoms and check for signs of preeclampsia during your prenatal visits. You may also have tests that include:  Checking your blood pressure.  Urine tests to check for protein. Your health care provider will check for this at every prenatal visit.  Blood tests.  Monitoring your baby's heart rate.  Ultrasound. How is this treated? You and your health care provider will determine the treatment approach that is best for you. Treatment may include:  Having more frequent prenatal exams to check for signs of preeclampsia, if you have an increased risk for preeclampsia.  Medicine to lower your blood pressure.  Staying in the hospital, if your condition is severe. There, treatment will focus on controlling your blood pressure and the amount of fluids in your body (fluid retention).  Taking medicine (magnesium sulfate) to prevent seizures. This may be given as an injection or through an IV.  Taking a low-dose aspirin during your pregnancy.  Delivering your baby early. You may have your labor started with medicine (induced), or you may have a cesarean delivery. Follow these instructions at home: Eating and drinking   Drink enough fluid to keep your urine pale yellow.  Avoid caffeine. Lifestyle  Do not use any products that contain nicotine or tobacco, such as cigarettes and e-cigarettes. If you need help quitting, ask your health care provider.  Do not use alcohol or drugs.  Avoid stress as much as possible. Rest and get   plenty of sleep. General instructions  Take over-the-counter and prescription medicines only as told by your health care provider.  When lying down, lie on your left side. This keeps pressure off your  major blood vessels.  When sitting or lying down, raise (elevate) your feet. Try putting some pillows underneath your lower legs.  Exercise regularly. Ask your health care provider what kinds of exercise are best for you.  Keep all follow-up and prenatal visits as told by your health care provider. This is important. How is this prevented? There is no known way of preventing preeclampsia or eclampsia from developing. However, to lower your risk of complications and detect problems early:  Get regular prenatal care. Your health care provider may be able to diagnose and treat the condition early.  Maintain a healthy weight. Ask your health care provider for help managing weight gain during pregnancy.  Work with your health care provider to manage any long-term (chronic) health conditions you have, such as diabetes or kidney problems.  You may have tests of your blood pressure and kidney function after giving birth.  Your health care provider may have you take low-dose aspirin during your next pregnancy. Contact a health care provider if:  You have symptoms that your health care provider told you may require more treatment or monitoring, such as: ? Headaches. ? Nausea or vomiting. ? Abdominal pain. ? Dizziness. ? Light-headedness. Get help right away if:  You have severe: ? Abdominal pain. ? Headaches that do not get better. ? Dizziness. ? Vision problems. ? Confusion. ? Nausea or vomiting.  You have any of the following: ? A seizure. ? Sudden, rapid weight gain. ? Sudden swelling in your hands, ankles, or face. ? Trouble moving any part of your body. ? Numbness in any part of your body. ? Trouble speaking. ? Abnormal bleeding.  You faint. Summary  Preeclampsia is a serious condition that may develop during pregnancy.  This condition causes high blood pressure and increased protein in your urine along with other symptoms, such as headaches and vision  changes.  Diagnosing and treating preeclampsia early is very important. If not treated early, it can cause serious problems for you and your baby.  Get help right away if you have symptoms that your health care provider told you to watch for. This information is not intended to replace advice given to you by your health care provider. Make sure you discuss any questions you have with your health care provider. Document Revised: 12/28/2017 Document Reviewed: 12/02/2015 Elsevier Patient Education  2020 Elsevier Inc.  

## 2019-11-26 NOTE — Discharge Summary (Signed)
Physician Discharge Summary  Patient ID: Grace Becker MRN: [id number] DOB/AGE: 1996/01/10 24 y.o.  Admit date: 11/24/2019 Discharge date: 11/26/2019  Admission Diagnoses: Postpartum pre eclampsia  Discharge Diagnoses:  Active Problems:   Preeclampsia in postpartum period   Discharged Condition: good  Hospital Course: pt was admitted with severe range pressures 15 days post partum Had a similar course with previous pregnancy All labs were normal No CNS symptoms No evidence of pulmonary edema or heart failure Was placed on magnesium prophylaxis and nifedipine twice daily 30 XL with appropriate BP management  Consults: None  Significant Diagnostic Studies: labs:   Results for orders placed or performed during the hospital encounter of 11/24/19 (from the past 72 hour(s))  Urinalysis, Routine w reflex microscopic     Status: Abnormal   Collection Time: 11/24/19  4:44 PM  Result Value Ref Range   Color, Urine STRAW (A) YELLOW   APPearance CLEAR CLEAR   Specific Gravity, Urine 1.012 1.005 - 1.030   pH 6.0 5.0 - 8.0   Glucose, UA NEGATIVE NEGATIVE mg/dL   Hgb urine dipstick MODERATE (A) NEGATIVE   Bilirubin Urine NEGATIVE NEGATIVE   Ketones, ur NEGATIVE NEGATIVE mg/dL   Protein, ur NEGATIVE NEGATIVE mg/dL   Nitrite NEGATIVE NEGATIVE   Leukocytes,Ua SMALL (A) NEGATIVE   RBC / HPF 21-50 0 - 5 RBC/hpf   WBC, UA 6-10 0 - 5 WBC/hpf   Bacteria, UA RARE (A) NONE SEEN   Mucus PRESENT     Comment: Performed at [facility], [address], [place], [place] [address]  Comprehensive metabolic panel     Status: Abnormal   Collection Time: 11/24/19  5:35 PM  Result Value Ref Range   Sodium 142 135 - 145 mmol/L   Potassium 3.7 3.5 - 5.1 mmol/L   Chloride 112 (H) 98 - 111 mmol/L   CO2 21 (L) 22 - 32 mmol/L   Glucose, Bld 103 (H) 70 - 99 mg/dL    Comment: Glucose reference range applies only to samples taken after fasting for at least 8 hours.   BUN 11 6 - 20 mg/dL    Creatinine, Ser 0.86 0.44 - 1.00 mg/dL   Calcium 8.9 8.9 - 10.3 mg/dL   Total Protein 6.8 6.5 - 8.1 g/dL   Albumin 3.5 3.5 - 5.0 g/dL   AST 17 15 - 41 U/L   ALT 19 0 - 44 U/L   Alkaline Phosphatase 106 38 - 126 U/L   Total Bilirubin 0.5 0.3 - 1.2 mg/dL   GFR calc non Af Amer >60 >60 mL/min   GFR calc Af Amer >60 >60 mL/min   Anion gap 9 5 - 15    Comment: Performed at [facility], [place] [address], [place], [place] [address]  CBC     Status: Abnormal   Collection Time: 11/24/19  5:35 PM  Result Value Ref Range   WBC 4.6 4.0 - 10.5 K/uL   RBC 4.32 3.87 - 5.11 MIL/uL   Hemoglobin 10.9 (L) 12.0 - 15.0 g/dL   HCT 35.0 (L) 36 - 46 %   MCV 81.0 80.0 - 100.0 fL   MCH 25.2 (L) 26.0 - 34.0 pg   MCHC 31.1 30.0 - 36.0 g/dL   RDW 21.2 (H) 11.5 - 15.5 %   Platelets 366 150 - 400 K/uL   nRBC 0.0 0.0 - 0.2 %    Comment: Performed at [facility], [place] [address] [place], [place] [address]  Type and screen [place]     Status: None   Collection Time: 11/24/19  5:35 PM  Result Value Ref Range   ABO/RH(D) A POS    Antibody Screen NEG    Sample Expiration      11/27/2019,2359 Performed at [facility], [place] [address], [place], [place] [address]   SARS Coronavirus 2 by RT PCR (hospital order, performed in [facility] hospital lab) Nasopharyngeal Nasopharyngeal Swab     Status: None   Collection Time: 11/24/19  5:47 PM   Specimen: Nasopharyngeal Swab  Result Value Ref Range   SARS Coronavirus 2 NEGATIVE NEGATIVE    Comment: (NOTE) SARS-CoV-2 target nucleic acids are NOT DETECTED.  The SARS-CoV-2 RNA is generally detectable in upper and lower respiratory specimens during the acute phase of infection. The lowest concentration of SARS-CoV-2 viral copies this assay can detect is 250 copies / mL. A negative result does not preclude SARS-CoV-2 infection and should not be used as the sole basis for treatment or other patient management decisions.  A negative  result may occur with improper specimen collection / handling, submission of specimen other than nasopharyngeal swab, presence of viral mutation(s) within the areas targeted by this assay, and inadequate number of viral copies (<250 copies / mL). A negative result must be combined with clinical observations, patient history, and epidemiological information.  Fact Sheet for Patients:   StrictlyIdeas.no  Fact Sheet for Healthcare Providers: BankingDealers.co.za  This test is not yet approved or  cleared by the [place] FDA and has been authorized for detection and/or diagnosis of SARS-CoV-2 by FDA under an Emergency Use Authorization (EUA).  This EUA will remain in effect (meaning this test can be used) for the duration of the COVID-19 declaration under Section 564(b)(1) of the Act, 21 U.S.C. section 360bbb-3(b)(1), unless the authorization is terminated or revoked sooner.  Performed at [facility], [place] [address] [place], [place] [address]     Treatments: magnesium and antihypertensives  Discharge Exam: Blood pressure (!) 136/97, pulse 71, temperature (!) 97.5 F (36.4 C), temperature source Oral, resp. rate 18, height 5' (1.524 m), weight 55.8 kg, SpO2 100 %, unknown if currently breastfeeding. General appearance: alert, cooperative and no distress GI: soft, non-tender; bowel sounds normal; no masses,  no organomegaly DTRs normal No peripheral edema Disposition: Discharge disposition: 01-Home or Self Care       Discharge Instructions    Activity as tolerated   Complete by: As directed    Call MD for:   Complete by: As directed    For home BP >160/105   Call MD for:  persistant nausea and vomiting   Complete by: As directed    Call MD for:  severe uncontrolled pain   Complete by: As directed    Call MD for:  temperature >100.4   Complete by: As directed    Diet - low sodium heart healthy   Complete by: As  directed    Sexual activity   Complete by: As directed    No sex for 6 weeks postpartum     Allergies as of 11/26/2019   No Known Allergies     Medication List    TAKE these medications   acetaminophen 325 MG tablet Commonly known as: Tylenol Take 2 tablets (650 mg total) by mouth every 4 (four) hours as needed (for pain scale < 4).   Blood Pressure Kit Devi 1 Device by Does not apply route as  needed.   ibuprofen 600 MG tablet Commonly known as: ADVIL Take 1 tablet (600 mg total) by mouth every 6 (six) hours as needed.   NIFEdipine 30 MG 24 hr tablet Commonly known as: ADALAT CC Take 1 tablet (30 mg total) by mouth 2 (two) times daily.   polyethylene glycol powder 17 GM/SCOOP powder Commonly known as: GLYCOLAX/MIRALAX Take 17 g by mouth daily as needed.   PRENATAL GUMMIES/DHA & FA PO Take 2 tablets by mouth daily.       Follow-up [facility] at [facility] Follow up in 1 week(s).   Specialty: Obstetrics and Gynecology Why: BP check, MyChart visit ok Contact information: [place] [phone] [phone]              Signed: Florian Buff 11/26/2019, 7:39 AM

## 2019-12-19 DIAGNOSIS — R87610 Atypical squamous cells of undetermined significance on cytologic smear of cervix (ASC-US): Secondary | ICD-10-CM | POA: Insufficient documentation

## 2019-12-21 ENCOUNTER — Ambulatory Visit: Payer: Medicaid Other

## 2020-01-08 ENCOUNTER — Encounter: Payer: Self-pay | Admitting: Family Medicine

## 2020-01-08 ENCOUNTER — Other Ambulatory Visit: Payer: Self-pay

## 2020-01-08 ENCOUNTER — Ambulatory Visit (INDEPENDENT_AMBULATORY_CARE_PROVIDER_SITE_OTHER): Payer: Medicaid Other

## 2020-01-08 DIAGNOSIS — R87610 Atypical squamous cells of undetermined significance on cytologic smear of cervix (ASC-US): Secondary | ICD-10-CM

## 2020-01-08 DIAGNOSIS — Z8759 Personal history of other complications of pregnancy, childbirth and the puerperium: Secondary | ICD-10-CM

## 2020-01-08 NOTE — Progress Notes (Signed)
Post Partum Visit Note  Grace Becker is a 24 y.o. [phone] female who presents for a postpartum visit. She is 9 weeks postpartum following a VAVD.  I have fully reviewed the prenatal and intrapartum course. The delivery was at 39.6 gestational weeks.  Anesthesia: epidural. Postpartum course has been unremarkable. Baby is doing well. Baby is feeding by bottle - gerber good start. Bleeding thin lochia. Bowel function is normal. Bladder function is normal. Patient is not sexually active. Contraception method is Nexplanon. Postpartum depression screening: Negative.  Patient works from home at [employer].  Patient receives support from FOB and her mother.  Patient denies feelings of depression.  Patient reports Nexplanon is in place and is dissatisfied.  She states her flow is irregular and unpredictable. Patient states she was on Depo Provera and is considering returning to that.   The pregnancy intention screening data noted above was reviewed. Potential methods of contraception were discussed. The patient elected to proceed with Hormonal Implant, which was placed in the hospital.    Edinburgh Postnatal Depression Scale - 01/08/20 0928      Edinburgh Postnatal Depression Scale:  In the Past 7 Days   I have been able to laugh and see the funny side of things. 0    I have looked forward with enjoyment to things. 0    I have blamed myself unnecessarily when things went wrong. 0    I have been anxious or worried for no good reason. 0    I have felt scared or panicky for no good reason. 0    Things have been getting on top of me. 0    I have been so unhappy that I have had difficulty sleeping. 0    I have felt sad or miserable. 0    I have been so unhappy that I have been crying. 0    The thought of harming myself has occurred to me. 0    Edinburgh Postnatal Depression Scale Total 0            The following portions of the patient's history were reviewed and updated as appropriate:  allergies, current medications, past family history, past medical history, past social history, past surgical history and problem list.  Review of Systems Pertinent items noted in HPI and remainder of comprehensive ROS otherwise negative.    Objective:  Blood pressure 125/74, pulse (!) 107, weight 114 lb (51.7 kg), unknown if currently breastfeeding.  General:  alert, cooperative and no distress   Breasts:  No Examined. Pt Denies Issues  Lungs: clear to auscultation bilaterally  Heart:  regular rate and rhythm  Abdomen: soft, non-tender; bowel sounds normal; no masses,  no organomegaly   Vulva:  not evaluated  Vagina: not evaluated  Cervix:  Not assessed  Corpus: not examined  Adnexa:  not evaluated  Rectal Exam: Normal rectovaginal exam        Assessment:   9 week postpartum exam.  Pap smear 08/25/2019-ASCUS Normal Involution Nexplanon-in place Elevated BP on Adalat  Plan:  1. Postpartum care and examination -Doing well -Okay to return to normal activities.  Requested and given note to RTW. -Discussed anticipated changes in bleeding pattern with Nexplanon. -Encouraged to contact office if additional medications necessary.   2. History of pregnancy induced hypertension -Taking Adalat CC 30mg  BID. -BP today 125/75 -Dr. consulted and advised decrease to once daily and have patient follow up with PCP. -Patient without PCP.  Given information for  [facility].  Encouraged to call and make appt for today.  -Encouraged to monitor nutritional intake and start an exercise routine.   3. ASCUS of cervix with negative high risk HPV -Will repeat in 3 years  Essential components of care per ACOG recommendations:  1.  Mood and well being: Patient with negative depression screening today. Reviewed local resources for support.  - Patient does not use tobacco.  - hx of drug use? No    2. Infant care and feeding:  -Patient currently breastmilk feeding? No    -Social determinants of health (SDOH) reviewed in EPIC. No concerns 3. Sexuality, contraception and birth spacing - Patient does not want a pregnancy in the next year.  Desired family size is 2 children. "A strong know."  - Reviewed forms of contraception in tiered fashion. Patient has Nexplanon in place.   - Discussed birth spacing of 18 months  4. Sleep and fatigue -Encouraged family/partner/community support of 4 hrs of uninterrupted sleep to help with mood and fatigue  5. Physical Recovery  - Discussed patients delivery and complications.  States it "was so much better than my daughters." - Patient had a 2nd degree laceration, perineal healing reviewed. Patient expressed understanding - Patient has urinary incontinence? No - Patient is safe to resume physical and sexual activity  6.  Health Maintenance - Last pap smear done April 2021 and was abnormal with ASCUS with negative HPV. No Mammogram d/t history.  7. Chronic Disease - PCP follow up-Information Given  Cherre Robins, CNM [facility] [employer], [facility]

## 2020-01-08 NOTE — Patient Instructions (Addendum)
Dr. Grayce Sessions  [facility] [address] [place],  [place]  [address] Main: [phone]  Fax: [phone]  Sunday:Closed Monday:8:30 AM - 5:30 PM Tuesday:8:30 AM - 5:30 PM Wednesday:8:30 AM - 5:30 PM Thursday:8:30 AM - 5:30 PM Friday:8:30 AM - 5:30 PM Saturday:Closed   How to Take Your Blood Pressure Blood pressure is a measurement of how strongly your blood is pressing against the walls of your arteries. Arteries are blood vessels that carry blood from your heart throughout your body. Your health care provider takes your blood pressure at each office visit. You can also take your own blood pressure at home with a blood pressure machine. You may need to take your own blood pressure:  To confirm a diagnosis of high blood pressure (hypertension).  To monitor your blood pressure over time.  To make sure your blood pressure medicine is working. Supplies needed: To take your blood pressure, you will need a blood pressure machine. You can buy a blood pressure machine, or blood pressure monitor, at most drugstores or online. There are several types of home blood pressure monitors. When choosing one, consider the following:  Choose a monitor that has an arm cuff.  Choose a cuff that wraps snugly around your upper arm. You should be able to fit only one finger between your arm and the cuff.  Do not choose a monitor that measures your blood pressure from your wrist or finger. Your health care provider can suggest a reliable monitor that will meet your needs. How to prepare To get the most accurate reading, avoid the following for 30 minutes before you check your blood pressure:  Drinking caffeine.  Drinking alcohol.  Eating.  Smoking.  Exercising. Five minutes before you check your blood pressure:  Empty your bladder.  Sit quietly without talking in a dining chair, rather than in a soft couch or armchair. How to take your blood pressure To  check your blood pressure, follow the instructions in the manual that came with your blood pressure monitor. If you have a digital blood pressure monitor, the instructions may be as follows: 1. Sit up straight. 2. Place your feet on the floor. Do not cross your ankles or legs. 3. Rest your left arm at the level of your heart on a table or desk or on the arm of a chair. 4. Pull up your shirt sleeve. 5. Wrap the blood pressure cuff around the upper part of your left arm, 1 inch (2.5 cm) above your elbow. It is best to wrap the cuff around bare skin. 6. Fit the cuff snugly around your arm. You should be able to place only one finger between the cuff and your arm. 7. Position the cord inside the groove of your elbow. 8. Press the power button. 9. Sit quietly while the cuff inflates and deflates. 10. Read the digital reading on the monitor screen and write it down (record it). 11. Wait 2-3 minutes, then repeat the steps, starting at step 1. What does my blood pressure reading mean? A blood pressure reading consists of a higher number over a lower number. Ideally, your blood pressure should be below 120/80. The first ("top") number is called the systolic pressure. It is a measure of the pressure in your arteries as your heart beats. The second ("bottom") number is called the diastolic pressure. It is a measure of the pressure in your arteries as the heart relaxes. Blood pressure is classified into four stages. The following are  the stages for adults who do not have a short-term serious illness or a chronic condition. Systolic pressure and diastolic pressure are measured in a unit called mm Hg. Normal  Systolic pressure: below 120.  Diastolic pressure: below 80. Elevated  Systolic pressure: 120-129.  Diastolic pressure: below 80. Hypertension stage 1  Systolic pressure: 130-139.  Diastolic pressure: 80-89. Hypertension stage 2  Systolic pressure: 140 or above.  Diastolic pressure: 90 or  above. You can have prehypertension or hypertension even if only the systolic or only the diastolic number in your reading is higher than normal. Follow these instructions at home:  Check your blood pressure as often as recommended by your health care provider.  Take your monitor to the next appointment with your health care provider to make sure: ? That you are using it correctly. ? That it provides accurate readings.  Be sure you understand what your goal blood pressure numbers are.  Tell your health care provider if you are having any side effects from blood pressure medicine. Contact a health care provider if:  Your blood pressure is consistently high. Get help right away if:  Your systolic blood pressure is higher than 180.  Your diastolic blood pressure is higher than 110. This information is not intended to replace advice given to you by your health care provider. Make sure you discuss any questions you have with your health care provider. Document Revised: 04/09/2017 Document Reviewed: 10/04/2015 Elsevier Patient Education  2020 [employer].

## 2020-02-23 ENCOUNTER — Other Ambulatory Visit: Payer: Self-pay

## 2020-02-23 NOTE — Patient Outreach (Signed)
Care Coordination  02/23/2020  Grace Becker 1995-10-22 [id number] Grace Becker is a 24 y.o. year old female who sees Pcp, No for primary care. The  [facility] Managed [facility] team was consulted for assistance with Patient does not have a PCP.. Ms. Mcgaugh was given information about Care Management services, agreed to services, and verbal consent for services was obtained.  Interventions:  . Patient interviewed and appropriate assessments performed . Collaborated with clinical team regarding patient needs  . SDOH (Social Determinants of Health) assessments performed: Yes    . Collaborated with [facility] primary care provider office to assist with appointment scheduling.  Office was closed.  Plan:  .  Marland Kitchen Social Worker will contact [facility] on 02/26/20  Gus Puma, BSW, [facility]  [place]  High Risk Managed Medicaid Team

## 2020-02-23 NOTE — Patient Instructions (Signed)
Visit Information  Grace Becker was given information about Medicaid Managed Care team care coordination services and consented to engagement with the [facility] Managed Care team.     Social Worker will follow up with patient on 02/26/20 once appointment has been made.   Gus Puma, BSW, [facility]   [place]  High Risk Managed Medicaid Team

## 2020-02-26 ENCOUNTER — Other Ambulatory Visit: Payer: Self-pay

## 2020-02-26 NOTE — Patient Instructions (Signed)
Visit Information  Grace Becker was given information about Medicaid Managed Care team care coordination services as a part of their Healthy [employer] Medicaid benefit. HARLOWE DOWLER verbally consented to engagement with the [facility] Managed Care team.    Your new patient appointment has been made for 03/04/20 at 1:00PM at [facility]. They care located at [address], [address], [place], [place]. They can be  reached at [phone].  For questions related to your Healthy [facility] health plan, please call: [phone] or visit the homepage here: [email or web address]  If you would like to schedule transportation through your Healthy [facility] plan, please call the following number at least 2 days in advance of your appointment: [phone]    No further follow up required: Patients new PCP appointment has been made.  Gus Puma, BSW, [facility]  [place]  High Risk Managed Medicaid Team

## 2020-02-26 NOTE — Patient Outreach (Signed)
Care Coordination  02/26/2020  Grace Becker October 11, 1995 [id number]  Grace Becker is a 24 y.o. year old female who sees Pcp, No for primary care. The  [facility] Managed Care team was consulted for assistance with Patient does not have a PCP.. Ms. Simoneaux was given information about Care Management services, agreed to services, and verbal consent for services was obtained.  Interventions:   Patient interviewed and appropriate assessments performed  Collaborated with clinical team regarding patient needs   SDOH (Social Determinants of Health) assessments performed: Yes     Collaborated with [facility] primary care provider office to assist with appointment scheduling.  Patient has new PCP appointment on 10/25 at 1PM.  Plan:   No further follow up required: Patient does not need assistance with any other resources.  Gus Puma, BSW, [facility]   [place]  High Risk Managed Medicaid Team

## 2020-03-04 ENCOUNTER — Ambulatory Visit (INDEPENDENT_AMBULATORY_CARE_PROVIDER_SITE_OTHER): Payer: Medicaid Other | Admitting: Nurse Practitioner

## 2020-03-04 ENCOUNTER — Encounter: Payer: Self-pay | Admitting: Nurse Practitioner

## 2020-03-04 ENCOUNTER — Other Ambulatory Visit: Payer: Self-pay

## 2020-03-04 VITALS — BP 136/84 | HR 97 | Temp 98.4°F | Resp 18 | Ht 60.0 in | Wt 112.6 lb

## 2020-03-04 DIAGNOSIS — Z113 Encounter for screening for infections with a predominantly sexual mode of transmission: Secondary | ICD-10-CM | POA: Diagnosis not present

## 2020-03-04 DIAGNOSIS — Z7689 Persons encountering health services in other specified circumstances: Secondary | ICD-10-CM

## 2020-03-04 DIAGNOSIS — I10 Essential (primary) hypertension: Secondary | ICD-10-CM | POA: Diagnosis not present

## 2020-03-04 DIAGNOSIS — R82998 Other abnormal findings in urine: Secondary | ICD-10-CM

## 2020-03-04 DIAGNOSIS — R6889 Other general symptoms and signs: Secondary | ICD-10-CM

## 2020-03-04 LAB — POCT URINALYSIS DIPSTICK
Bilirubin, UA: NEGATIVE
Blood, UA: NEGATIVE
Glucose, UA: NEGATIVE
Ketones, UA: NEGATIVE
Nitrite, UA: NEGATIVE
Protein, UA: NEGATIVE
Spec Grav, UA: >=1.03 — AB (ref 1.010–1.025)
Urobilinogen, UA: 0.2 E.U./dL
pH, UA: 5.5 (ref 5.0–8.0)

## 2020-03-04 LAB — POCT GLYCOSYLATED HEMOGLOBIN (HGB A1C)
HbA1c POC (<> result, manual entry): 5.4 % (ref 4.0–5.6)
HbA1c, POC (controlled diabetic range): 5.4 % (ref 0.0–7.0)
HbA1c, POC (prediabetic range): 5.4 % — AB (ref 5.7–6.4)
Hemoglobin A1C: 5.4 % (ref 4.0–5.6)

## 2020-03-04 LAB — GLUCOSE, POCT (MANUAL RESULT ENTRY): POC Glucose: 81 mg/dl (ref 70–99)

## 2020-03-04 MED ORDER — NIFEDIPINE ER 30 MG PO TB24: 30.0000 mg | ORAL_TABLET | Freq: Every day | ORAL | 0 refills | Status: DC

## 2020-03-04 NOTE — Patient Instructions (Addendum)
Managing Your Hypertension Hypertension is commonly called high blood pressure. This is when the force of your blood pressing against the walls of your arteries is too strong. Arteries are blood vessels that carry blood from your heart throughout your body. Hypertension forces the heart to work harder to pump blood, and may cause the arteries to become narrow or stiff. Having untreated or uncontrolled hypertension can cause heart attack, stroke, kidney disease, and other problems. What are blood pressure readings? A blood pressure reading consists of a higher number over a lower number. Ideally, your blood pressure should be below 120/80. The first ("top") number is called the systolic pressure. It is a measure of the pressure in your arteries as your heart beats. The second ("bottom") number is called the diastolic pressure. It is a measure of the pressure in your arteries as the heart relaxes. What does my blood pressure reading mean? Blood pressure is classified into four stages. Based on your blood pressure reading, your health care provider may use the following stages to determine what type of treatment you need, if any. Systolic pressure and diastolic pressure are measured in a unit called mm Hg. Normal  Systolic pressure: below 120.  Diastolic pressure: below 80. Elevated  Systolic pressure: 120-129.  Diastolic pressure: below 80. Hypertension stage 1  Systolic pressure: 130-139.  Diastolic pressure: 80-89. Hypertension stage 2  Systolic pressure: 140 or above.  Diastolic pressure: 90 or above. What health risks are associated with hypertension? Managing your hypertension is an important responsibility. Uncontrolled hypertension can lead to:  A heart attack.  A stroke.  A weakened blood vessel (aneurysm).  Heart failure.  Kidney damage.  Eye damage.  Metabolic syndrome.  Memory and concentration problems. What changes can I make to manage my  hypertension? Hypertension can be managed by making lifestyle changes and possibly by taking medicines. Your health care provider will help you make a plan to bring your blood pressure within a normal range. Eating and drinking   Eat a diet that is high in fiber and potassium, and low in salt (sodium), added sugar, and fat. An example eating plan is called the DASH (Dietary Approaches to Stop Hypertension) diet. To eat this way: ? Eat plenty of fresh fruits and vegetables. Try to fill half of your plate at each meal with fruits and vegetables. ? Eat whole grains, such as whole wheat pasta, brown rice, or whole grain bread. Fill about one quarter of your plate with whole grains. ? Eat low-fat diary products. ? Avoid fatty cuts of meat, processed or cured meats, and poultry with skin. Fill about one quarter of your plate with lean proteins such as fish, chicken without skin, beans, eggs, and tofu. ? Avoid premade and processed foods. These tend to be higher in sodium, added sugar, and fat.  Reduce your daily sodium intake. Most people with hypertension should eat less than 1,500 mg of sodium a day.  Limit alcohol intake to no more than 1 drink a day for nonpregnant women and 2 drinks a day for men. One drink equals 12 oz of beer, 5 oz of wine, or 1 oz of hard liquor. Lifestyle  Work with your health care provider to maintain a healthy body weight, or to lose weight. Ask what an ideal weight is for you.  Get at least 30 minutes of exercise that causes your heart to beat faster (aerobic exercise) most days of the week. Activities may include walking, swimming, or biking.  Include exercise   to strengthen your muscles (resistance exercise), such as weight lifting, as part of your weekly exercise routine. Try to do these types of exercises for 30 minutes at least 3 days a week.  Do not use any products that contain nicotine or tobacco, such as cigarettes and e-cigarettes. If you need help quitting,  ask your health care provider.  Control any long-term (chronic) conditions you have, such as high cholesterol or diabetes. Monitoring  Monitor your blood pressure at home as told by your health care provider. Your personal target blood pressure may vary depending on your medical conditions, your age, and other factors.  Have your blood pressure checked regularly, as often as told by your health care provider. Working with your health care provider  Review all the medicines you take with your health care provider because there may be side effects or interactions.  Talk with your health care provider about your diet, exercise habits, and other lifestyle factors that may be contributing to hypertension.  Visit your health care provider regularly. Your health care provider can help you create and adjust your plan for managing hypertension. Will I need medicine to control my blood pressure? Your health care provider may prescribe medicine if lifestyle changes are not enough to get your blood pressure under control, and if:  Your systolic blood pressure is 130 or higher.  Your diastolic blood pressure is 80 or higher. Take medicines only as told by your health care provider. Follow the directions carefully. Blood pressure medicines must be taken as prescribed. The medicine does not work as well when you skip doses. Skipping doses also puts you at risk for problems. Contact a health care provider if:  You think you are having a reaction to medicines you have taken.  You have repeated (recurrent) headaches.  You feel dizzy.  You have swelling in your ankles.  You have trouble with your vision. Get help right away if:  You develop a severe headache or confusion.  You have unusual weakness or numbness, or you feel faint.  You have severe pain in your chest or abdomen.  You vomit repeatedly.  You have trouble breathing. Summary  Hypertension is when the force of blood pumping  through your arteries is too strong. If this condition is not controlled, it may put you at risk for serious complications.  Your personal target blood pressure may vary depending on your medical conditions, your age, and other factors. For most people, a normal blood pressure is less than 120/80.  Hypertension is managed by lifestyle changes, medicines, or both. Lifestyle changes include weight loss, eating a healthy, low-sodium diet, exercising more, and limiting alcohol. This information is not intended to replace advice given to you by your health care provider. Make sure you discuss any questions you have with your health care provider. Document Revised: 08/19/2018 Document Reviewed: 03/25/2016   Managing Anxiety, Adult After being diagnosed with an anxiety disorder, you may be relieved to know why you have felt or behaved a certain way. You may also feel overwhelmed about the treatment ahead and what it will mean for your life. With care and support, you can manage this condition and recover from it. How to manage lifestyle changes Managing stress and anxiety  Stress is your body's reaction to life changes and events, both good and bad. Most stress will last just a few hours, but stress can be ongoing and can lead to more than just stress. Although stress can play a major role in  anxiety, it is not the same as anxiety. Stress is usually caused by something external, such as a deadline, test, or competition. Stress normally passes after the triggering event has ended.  Anxiety is caused by something internal, such as imagining a terrible outcome or worrying that something will go wrong that will devastate you. Anxiety often does not go away even after the triggering event is over, and it can become long-term (chronic) worry. It is important to understand the differences between stress and anxiety and to manage your stress effectively so that it does not lead to an anxious response. Talk with  your health care provider or a counselor to learn more about reducing anxiety and stress. He or she may suggest tension reduction techniques, such as:  Music therapy. This can include creating or listening to music that you enjoy and that inspires you.  Mindfulness-based meditation. This involves being aware of your normal breaths while not trying to control your breathing. It can be done while sitting or walking.  Centering prayer. This involves focusing on a word, phrase, or sacred image that means something to you and brings you peace.  Deep breathing. To do this, expand your stomach and inhale slowly through your nose. Hold your breath for 3-5 seconds. Then exhale slowly, letting your stomach muscles relax.  Self-talk. This involves identifying thought patterns that lead to anxiety reactions and changing those patterns.  Muscle relaxation. This involves tensing muscles and then relaxing them. Choose a tension reduction technique that suits your lifestyle and personality. These techniques take time and practice. Set aside 5-15 minutes a day to do them. Therapists can offer counseling and training in these techniques. The training to help with anxiety may be covered by some insurance plans. Other things you can do to manage stress and anxiety include:  Keeping a stress/anxiety diary. This can help you learn what triggers your reaction and then learn ways to manage your response.  Thinking about how you react to certain situations. You may not be able to control everything, but you can control your response.  Making time for activities that help you relax and not feeling guilty about spending your time in this way.  Visual imagery and yoga can help you stay calm and relax.  Medicines Medicines can help ease symptoms. Medicines for anxiety include:  Anti-anxiety drugs.  Antidepressants. Medicines are often used as a primary treatment for anxiety disorder. Medicines will be prescribed by  a health care provider. When used together, medicines, psychotherapy, and tension reduction techniques may be the most effective treatment. Relationships Relationships can play a big part in helping you recover. Try to spend more time connecting with trusted friends and family members. Consider going to couples counseling, taking family education classes, or going to family therapy. Therapy can help you and others better understand your condition. How to recognize changes in your anxiety Everyone responds differently to treatment for anxiety. Recovery from anxiety happens when symptoms decrease and stop interfering with your daily activities at home or work. This may mean that you will start to:  Have better concentration and focus. Worry will interfere less in your daily thinking.  Sleep better.  Be less irritable.  Have more energy.  Have improved memory. It is important to recognize when your condition is getting worse. Contact your health care provider if your symptoms interfere with home or work and you feel like your condition is not improving. Follow these instructions at home: Activity  Exercise. Most adults should  do the following: ? Exercise for at least 150 minutes each week. The exercise should increase your heart rate and make you sweat (moderate-intensity exercise). ? Strengthening exercises at least twice a week.  Get the right amount and quality of sleep. Most adults need 7-9 hours of sleep each night. Lifestyle   Eat a healthy diet that includes plenty of vegetables, fruits, whole grains, low-fat dairy products, and lean protein. Do not eat a lot of foods that are high in solid fats, added sugars, or salt.  Make choices that simplify your life.  Do not use any products that contain nicotine or tobacco, such as cigarettes, e-cigarettes, and chewing tobacco. If you need help quitting, ask your health care provider.  Avoid caffeine, alcohol, and certain over-the-counter  cold medicines. These may make you feel worse. Ask your pharmacist which medicines to avoid. General instructions  Take over-the-counter and prescription medicines only as told by your health care provider.  Keep all follow-up visits as told by your health care provider. This is important. Where to find support You can get help and support from these sources:  Self-help groups.  Online and [employer].  A trusted spiritual leader.  Couples counseling.  Family education classes.  Family therapy. Where to find more information You may find that joining a support group helps you deal with your anxiety. The following sources can help you locate counselors or support groups near you:  Mental Health America: www.mentalhealthamerica.net  Anxiety and Depression Association of [place] (ADAA): ProgramCam.de  [employer] Mental Illness (NAMI): www.nami.org Contact a health care provider if you:  Have a hard time staying focused or finishing daily tasks.  Spend many hours a day feeling worried about everyday life.  Become exhausted by worry.  Start to have headaches, feel tense, or have nausea.  Urinate more than normal.  Have diarrhea. Get help right away if you have:  A racing heart and shortness of breath.  Thoughts of hurting yourself or others. If you ever feel like you may hurt yourself or others, or have thoughts about taking your own life, get help right away. You can go to your nearest emergency department or call:  Your local emergency services (911 in the [place]  A suicide crisis helpline, such as the National Suicide Prevention Lifeline at 707-083-3504. This is open 24 hours a day. Summary  Taking steps to learn and use tension reduction techniques can help calm you and help prevent triggering an anxiety reaction.  When used together, medicines, psychotherapy, and tension reduction techniques may be the most effective treatment.  Family,  friends, and partners can play a big part in helping you recover from an anxiety disorder. This information is not intended to replace advice given to you by your health care provider. Make sure you discuss any questions you have with your health care provider. Document Revised: 09/27/2018 Document Reviewed: 09/27/2018 Elsevier Patient Education  2020 [employer].  Elsevier Patient Education  [employer].

## 2020-03-04 NOTE — Progress Notes (Signed)
[facility] [place], [place]  [address] Phone:  [phone]   Fax:  [phone]   New Patient Office Visit  Subjective:  Patient ID: Grace Becker, female    DOB: 05-11-1996  Age: 24 y.o. MRN: [id number]  CC:  Chief Complaint  Patient presents with   Establish Care    Pt states she would like to dscuss her BP because it is still running high. Pt states she ran out ot her BP medication.    HPI Grace Becker presents to establish care. She  has a past medical history of Anxiety, History of pregnancy induced hypertension (08/23/2019), Preeclampsia in postpartum period (11/24/2019), and Pregnancy induced hypertension.   Hypertension Patient is here for follow-up of elevated blood pressure. She is not exercising and is not adherent to a low-salt diet.She is going to change her diet.  Blood pressure is not well controlled at home. 150/106; 139/89 Cardiac symptoms: none. Patient denies chest pain, chest pressure/discomfort, claudication, dyspnea, fatigue, irregular heart beat, lower extremity edema, near-syncope, palpitations and syncope. Cardiovascular risk factors: hypertension and sedentary lifestyle. Use of agents associated with hypertension: none. History of target organ damage: none. Anxiety Patient is here for evaluation of anxiety.  She has the following anxiety symptoms: difficulty concentrating, feelings of losing control.  She admits that with 2 young children this triggers anxiety.  Onset of symptoms was approximately several months ago.  Symptoms have been unchanged since that time. She denies current suicidal and homicidal ideation. Family history significant for anxiety.Possible organic causes contributing are: none. Risk factors: positive family history in  father Previous treatment includes none.  She does have the support of her mother.  She works from home.   Past Medical History:  Diagnosis Date   Anxiety    History of pregnancy  induced hypertension 08/23/2019   Postpartum after 1st child  _0  Aspirin 81 mg daily after 12 weeks Current antihypertensives:  None   Baseline and surveillance labs (pulled in from [facility], refresh links as needed)  Lab Results Component Value Date  PLT 225 08/25/2019  CREATININE 0.47 (L) 08/25/2019  AST 23 08/25/2019  ALT 10 08/25/2019  PROTCRRATIO 0.79 (H) 09/11/2016   Antenatal Testing CHTN - O10.919  Group I   BP < 140/90, no m   Preeclampsia in postpartum period 11/24/2019   Pregnancy induced hypertension     Past Surgical History:  Procedure Laterality Date   NO PAST SURGERIES      Family History  Problem Relation Age of Onset   Hypertension Other    Anxiety disorder Father     Social History   Socioeconomic History   Marital status: Single    Spouse name: Not on file   Number of children: Not on file   Years of education: Not on file   Highest education level: Not on file  Occupational History   Not on file  Tobacco Use   Smoking status: Never Smoker   Smokeless tobacco: Never Used  Vaping Use   Vaping Use: Never used  Substance and Sexual Activity   Alcohol use: No   Drug use: No   Sexual activity: Yes    Birth control/protection: None  Other Topics Concern   Not on file  Social History Narrative   Not on file   Social Determinants of Health   Financial Resource Strain:    Difficulty of Paying Living Expenses: Not on file  Food Insecurity: No Food  Insecurity   Worried About Charity fundraiser in the Last Year: Never true   [place] in the Last Year: Never true  Transportation Needs: No Transportation Needs   Lack of Transportation (Medical): No   Lack of Transportation (Non-Medical): No  Physical Activity:    Days of Exercise per Week: Not on file   Minutes of Exercise per Session: Not on file  Stress:    Feeling of Stress : Not on file  Social Connections:    Frequency of Communication with Friends and Family: Not on  file   Frequency of Social Gatherings with Friends and Family: Not on file   Attends Religious Services: Not on file   Active Member of Clubs or Organizations: Not on file   Attends Archivist Meetings: Not on file   Marital Status: Not on file  Intimate Partner Violence:    Fear of Current or Ex-Partner: Not on file   Emotionally Abused: Not on file   Physically Abused: Not on file   Sexually Abused: Not on file    ROS Review of Systems  Constitutional: Negative.   HENT: Negative.   Eyes: Negative.   Respiratory: Negative for shortness of breath.   Cardiovascular: Negative for chest pain, palpitations and leg swelling.  Gastrointestinal: Negative for constipation, diarrhea, nausea and vomiting.  Endocrine: Negative.   Genitourinary: Negative.   Musculoskeletal: Negative.   Skin: Negative.   Allergic/Immunologic: Negative.   Neurological: Negative for dizziness and headaches.  Hematological: Negative.     Objective:   Today's Vitals: BP 136/84 (BP Location: Right Arm, Patient Position: Sitting, Cuff Size: Normal)    Pulse 97    Temp 98.4 F (36.9 C)    Resp 18    Ht 5' (1.524 m)    Wt 112 lb 9.6 oz (51.1 kg)    LMP 02/26/2020 (Approximate)    SpO2 100%    BMI 21.99 kg/m   Physical Exam Constitutional:      Appearance: She is normal weight.  HENT:     Head: Normocephalic and atraumatic.  Cardiovascular:     Rate and Rhythm: Normal rate and regular rhythm.     Pulses: Normal pulses.     Heart sounds: Normal heart sounds.  Pulmonary:     Effort: Pulmonary effort is normal.     Breath sounds: Normal breath sounds.  Abdominal:     General: Bowel sounds are normal.     Palpations: Abdomen is soft.     Comments: Multiple striae  Musculoskeletal:        General: Normal range of motion.     Cervical back: Normal range of motion.  Skin:    General: Skin is warm and dry.     Capillary Refill: Capillary refill takes less than 2 seconds.  Neurological:       General: No focal deficit present.     Mental Status: She is alert and oriented to person, place, and time.  Psychiatric:        Mood and Affect: Mood normal.        Thought Content: Thought content normal.        Judgment: Judgment normal.     Assessment & Plan:   Problem List Items Addressed This Visit    None    Visit Diagnoses    Encounter to establish care    -  Primary    Relevant Orders   Urinalysis Dipstick (Completed)   POC Glucose (CBG) (  Completed)   POC HgB A1c (Completed)   Abnormal finding       Relevant Orders   Urine Culture (Completed)   Leukocytes in urine       Primary hypertension     Encouraged on going compliance with current medication regimen Encouraged home monitoring and recording BP <130/80 Eating a heart-healthy diet with less salt Encouraged regular physical activity    Relevant Medications   NIFEdipine (ADALAT CC) 30 MG 24 hr tablet   Screening for STD (sexually transmitted disease)       Relevant Orders   Chlamydia/Gonococcus/Trichomonas, NAA (Completed)      Outpatient Encounter Medications as of 03/04/2020  Medication Sig   Blood Pressure Monitoring (BLOOD PRESSURE KIT) DEVI 1 Device by Does not apply route as needed.   acetaminophen (TYLENOL) 325 MG tablet Take 2 tablets (650 mg total) by mouth every 4 (four) hours as needed (for pain scale < 4). (Patient not taking: Reported on 11/26/2019)   ibuprofen (ADVIL) 600 MG tablet Take 1 tablet (600 mg total) by mouth every 6 (six) hours as needed. (Patient not taking: Reported on 11/26/2019)   NIFEdipine (ADALAT CC) 30 MG 24 hr tablet Take 1 tablet (30 mg total) by mouth daily.   polyethylene glycol powder (GLYCOLAX/MIRALAX) 17 GM/SCOOP powder Take 17 g by mouth daily as needed. (Patient not taking: Reported on 11/26/2019)   [DISCONTINUED] NIFEdipine (ADALAT CC) 30 MG 24 hr tablet Take 1 tablet (30 mg total) by mouth 2 (two) times daily. (Patient not taking: Reported on 03/04/2020)    No facility-administered encounter medications on file as of 03/04/2020.    Follow-up: Return in about 6 weeks (around 04/15/2020).   Vevelyn Francois, NP

## 2020-03-05 LAB — CHLAMYDIA/GONOCOCCUS/TRICHOMONAS, NAA
Chlamydia by NAA: NEGATIVE
Gonococcus by NAA: NEGATIVE
Trich vag by NAA: NEGATIVE

## 2020-03-07 LAB — URINE CULTURE

## 2020-04-15 ENCOUNTER — Ambulatory Visit: Payer: Medicaid Other | Admitting: Nurse Practitioner

## 2022-10-30 ENCOUNTER — Ambulatory Visit: Payer: Self-pay | Admitting: Family Medicine

## 2022-12-11 ENCOUNTER — Ambulatory Visit: Payer: Self-pay | Admitting: Physician Assistant

## 2022-12-24 ENCOUNTER — Other Ambulatory Visit (HOSPITAL_COMMUNITY)
Admission: RE | Admit: 2022-12-24 | Discharge: 2022-12-24 | Disposition: A | Discharge status: Home / Self Care | Payer: 59 | Source: Ambulatory Visit | Attending: Internal Medicine | Admitting: Internal Medicine

## 2022-12-24 ENCOUNTER — Encounter: Payer: Self-pay | Admitting: Internal Medicine

## 2022-12-24 ENCOUNTER — Ambulatory Visit (INDEPENDENT_AMBULATORY_CARE_PROVIDER_SITE_OTHER): Payer: 59 | Admitting: Internal Medicine

## 2022-12-24 VITALS — BP 110/74 | HR 78 | Temp 98.3°F | Ht 60.0 in | Wt 132.2 lb

## 2022-12-24 DIAGNOSIS — Z975 Presence of (intrauterine) contraceptive device: Secondary | ICD-10-CM

## 2022-12-24 DIAGNOSIS — N898 Other specified noninflammatory disorders of vagina: Secondary | ICD-10-CM

## 2022-12-24 DIAGNOSIS — N760 Acute vaginitis: Secondary | ICD-10-CM

## 2022-12-24 DIAGNOSIS — B9689 Other specified bacterial agents as the cause of diseases classified elsewhere: Secondary | ICD-10-CM

## 2022-12-24 DIAGNOSIS — B3731 Acute candidiasis of vulva and vagina: Secondary | ICD-10-CM | POA: Diagnosis not present

## 2022-12-24 DIAGNOSIS — Z113 Encounter for screening for infections with a predominantly sexual mode of transmission: Secondary | ICD-10-CM | POA: Diagnosis present

## 2022-12-24 LAB — POC URINALSYSI DIPSTICK (AUTOMATED)
Bilirubin, UA: NEGATIVE
Glucose, UA: NEGATIVE
Ketones, UA: NEGATIVE
Nitrite, UA: NEGATIVE
Protein, UA: NEGATIVE
Spec Grav, UA: 1.02 (ref 1.010–1.025)
Urobilinogen, UA: 0.2 E.U./dL
pH, UA: 5.5 (ref 5.0–8.0)

## 2022-12-24 LAB — POCT URINE PREGNANCY: Preg Test, Ur: NEGATIVE

## 2022-12-24 MED ORDER — METRONIDAZOLE 500 MG PO TABS: 500.0000 mg | ORAL_TABLET | Freq: Two times a day (BID) | ORAL | 0 refills | Status: AC

## 2022-12-24 NOTE — Patient Instructions (Addendum)
  Do not drink alcohol with the antibiotic flagyl (metronidazole) as this drug will cause nausea and severe vomiting if you drink while taking antibiotic.

## 2022-12-24 NOTE — Progress Notes (Signed)
[facility] [address] [place] [address] Dept: [phone] Dept Fax: [phone]  New Patient Office Visit  Subjective:   Grace Becker 1996-04-04 12/24/2022  Chief Complaint  Patient presents with   Establish Care    Bv check vaginal smell started a while ago  Referral to Ob/Gyn to remove Nexplanon     HPI: Grace Becker presents today to establish care at [facility] [employer]. Introduced to Publishing rights manager role and practice setting.  All questions answered.  Concerns: See below   Patient has Nexplanon in left upper arm.  It is due to be replaced.  She needs referral to OB/GYN.  VAGINAL DISCHARGE: Onset: "awhile" , approximately couple months    Description: milky, creamy white    Symptoms Odor:  yes  - fishy smell , especially after intercourse Itching: no  Dysuria: no  Bleeding: no  Pelvic pain: no  Back pain: no  Fever: no  Genital sores: no  Rash: no  Dyspareunia: no  Prior treatment: no   Red Flags: Recent antibiotics: no Sexual activity: yes  Possible STD exposure: no  IUD: no  Diabetes: no      The following portions of the patient's history were reviewed and updated as appropriate: past medical history, past surgical history, family history, social history, allergies, medications, and problem list.   Patient Active Problem List   Diagnosis Date Noted   ASCUS of cervix with negative high risk HPV 12/19/2019   Nexplanon in place 11/11/2019   Past Medical History:  Diagnosis Date   Anxiety    History of pregnancy induced hypertension 08/23/2019   Postpartum after 1st child  [ ]  Aspirin 81 mg daily after 12 weeks Current antihypertensives:  None   Baseline and surveillance labs (pulled in from [facility], refresh links as needed)  Lab Results Component Value Date  PLT 225 08/25/2019  CREATININE 0.47 (L) 08/25/2019  AST 23 08/25/2019  ALT 10 08/25/2019   PROTCRRATIO 0.79 (H) 09/11/2016   Antenatal Testing CHTN - O10.919  Group I   BP < 140/90, no m   Preeclampsia in postpartum period 11/24/2019   Pregnancy induced hypertension    Past Surgical History:  Procedure Laterality Date   NO PAST SURGERIES     Family History  Problem Relation Age of Onset   Hypertension Other    Anxiety disorder Father    Outpatient Medications Prior to Visit  Medication Sig Dispense Refill   escitalopram (LEXAPRO) 5 MG tablet Take 5 mg by mouth daily.     acetaminophen (TYLENOL) 325 MG tablet Take 2 tablets (650 mg total) by mouth every 4 (four) hours as needed (for pain scale < 4). (Patient not taking: Reported on 11/26/2019) 30 tablet 0   Blood Pressure Monitoring (BLOOD PRESSURE KIT) DEVI 1 Device by Does not apply route as needed. (Patient not taking: Reported on 12/24/2022) 1 each 0   ibuprofen (ADVIL) 600 MG tablet Take 1 tablet (600 mg total) by mouth every 6 (six) hours as needed. (Patient not taking: Reported on 11/26/2019) 30 tablet 0   NIFEdipine (ADALAT CC) 30 MG 24 hr tablet Take 1 tablet (30 mg total) by mouth daily. 90 tablet 0   polyethylene glycol powder (GLYCOLAX/MIRALAX) 17 GM/SCOOP powder Take 17 g by mouth daily as needed. (Patient not taking: Reported on 11/26/2019) 510 g 1   No facility-administered medications prior to visit.   No Known Allergies  ROS: A complete ROS  was performed with pertinent positives/negatives noted in the HPI. The remainder of the ROS are negative.   Objective:   Today's Vitals   12/24/22 1557  BP: 110/74  Pulse: 78  Temp: 98.3 F (36.8 C)  TempSrc: Temporal  SpO2: 99%  Weight: 132 lb 3.2 oz (60 kg)  Height: 5' (1.524 m)    GENERAL: Well-appearing, in NAD. Well nourished.  SKIN: Pink, warm and dry. No rash, lesion, ulceration, or ecchymoses.  NECK: Trachea midline. Full ROM w/o pain or tenderness. No lymphadenopathy.  RESPIRATORY: Chest wall symmetrical. Respirations even and non-labored. Breath sounds  clear to auscultation bilaterally.  CARDIAC: S1, S2 present, regular rate and rhythm. Peripheral pulses 2+ bilaterally.  EXTREMITIES: Without clubbing, cyanosis, or edema.  NEUROLOGIC: No motor or sensory deficits. Steady, even gait.  PSYCH/MENTAL STATUS: Alert, oriented x 3. Cooperative, appropriate mood and affect.   Health Maintenance Due  Topic Date Due   HPV VACCINES (1 - 3-dose series) Never done   PAP-Cervical Cytology Screening  08/25/2022   PAP SMEAR-Modifier  08/25/2022   INFLUENZA VACCINE  12/10/2022    Results for orders placed or performed in visit on 12/24/22  POCT Urinalysis Dipstick (Automated)  Result Value Ref Range   Color, UA yellow    Clarity, UA clear    Glucose, UA Negative Negative   Bilirubin, UA neg    Ketones, UA neg    Spec Grav, UA 1.020 1.010 - 1.025   Blood, UA trace    pH, UA 5.5 5.0 - 8.0   Protein, UA Negative Negative   Urobilinogen, UA 0.2 0.2 or 1.0 E.U./dL   Nitrite, UA neg    Leukocytes, UA Large (3+) (A) Negative  POCT urine pregnancy  Result Value Ref Range   Preg Test, Ur Negative Negative    Assessment & Plan:  1. Vaginal discharge - Cervicovaginal ancillary only - POCT Urinalysis Dipstick (Automated) - POCT urine pregnancy  2. BV (bacterial vaginosis) - metroNIDAZOLE (FLAGYL) 500 MG tablet; Take 1 tablet (500 mg total) by mouth 2 (two) times daily for 7 days.  Dispense: 14 tablet; Refill: 0  3. Nexplanon in place - Ambulatory referral to Obstetrics / Gynecology   Return in about 3 months (around 03/26/2023) for Fasting Annual Physical Exam.   Of note, portions of this note may have been created with voice recognition software Physicist, medical). While this note has been edited for accuracy, occasional wrong-word or 'sound-a-like' substitutions may have occurred due to the inherent limitations of voice recognition software.  Salvatore Decent, FNP

## 2022-12-25 ENCOUNTER — Encounter: Payer: Self-pay | Admitting: Internal Medicine

## 2022-12-28 ENCOUNTER — Other Ambulatory Visit: Payer: Self-pay | Admitting: Internal Medicine

## 2022-12-28 DIAGNOSIS — B3731 Acute candidiasis of vulva and vagina: Secondary | ICD-10-CM

## 2022-12-28 LAB — CERVICOVAGINAL ANCILLARY ONLY
Bacterial Vaginitis (gardnerella): POSITIVE — AB
Candida Glabrata: NEGATIVE
Candida Vaginitis: POSITIVE — AB
Chlamydia: NEGATIVE
Comment: NEGATIVE
Comment: NEGATIVE
Comment: NEGATIVE
Comment: NEGATIVE
Comment: NEGATIVE
Comment: NORMAL
Neisseria Gonorrhea: NEGATIVE
Trichomonas: NEGATIVE

## 2022-12-28 MED ORDER — FLUCONAZOLE 150 MG PO TABS: ORAL_TABLET | ORAL | 0 refills | Status: AC

## 2022-12-30 ENCOUNTER — Ambulatory Visit: Payer: 59 | Admitting: Internal Medicine

## 2022-12-31 NOTE — Telephone Encounter (Signed)
LVM for pt to see if she would still like for Grace Becker to send in a cream versus the tablets that were ordered for her.

## 2023-01-04 ENCOUNTER — Encounter: Payer: 59 | Admitting: Internal Medicine

## 2023-01-06 ENCOUNTER — Other Ambulatory Visit: Payer: Self-pay | Admitting: Internal Medicine

## 2023-01-06 DIAGNOSIS — B9689 Other specified bacterial agents as the cause of diseases classified elsewhere: Secondary | ICD-10-CM

## 2023-01-06 MED ORDER — METRONIDAZOLE 0.75 % VA GEL: 1.0000 | Freq: Every day | VAGINAL | 0 refills | Status: AC

## 2023-02-08 ENCOUNTER — Telehealth: Payer: Self-pay

## 2023-02-08 NOTE — Telephone Encounter (Signed)
Left voicemail for patient to call back and confirm their upcoming appointment. Informed them that if we do not receive confirmation from them prior to the appointment, it will be cancelled.

## 2023-02-11 ENCOUNTER — Ambulatory Visit: Payer: 59 | Admitting: Family Medicine

## 2023-02-11 ENCOUNTER — Encounter: Payer: Self-pay | Admitting: Family Medicine

## 2023-02-11 VITALS — BP 114/82 | HR 73 | Wt 131.0 lb

## 2023-02-11 DIAGNOSIS — Z3046 Encounter for surveillance of implantable subdermal contraceptive: Secondary | ICD-10-CM | POA: Diagnosis not present

## 2023-02-11 MED ORDER — NORETHIN ACE-ETH ESTRAD-FE 1-20 MG-MCG(24) PO TABS: 1.0000 | ORAL_TABLET | Freq: Every day | ORAL | 3 refills | Status: DC

## 2023-02-11 NOTE — Progress Notes (Signed)
Nexplanon placed 3+ years ago. Wants removed and wants to start COC continuously.  Nexplanon Removal:  Patient given informed consent for removal of her Implanon, time out was performed.  Signed copy in the chart.  Appropriate time out taken. Implanon site identified.  Area prepped in usual sterile fashon. One cc of 1% lidocaine was used to anesthetize the area at the distal end of the implant. A small stab incision was made right beside the implant on the distal portion.  The implanon rod was grasped using hemostats and removed without difficulty.  There was less than 3 cc blood loss. There were no complications.  A small amount of antibiotic ointment and steri-strips were applied over the small incision.  A pressure bandage was applied to reduce any bruising.  The patient tolerated the procedure well and was given post procedure instructions.

## 2023-03-17 ENCOUNTER — Ambulatory Visit: Payer: 59 | Admitting: Family Medicine

## 2023-03-26 ENCOUNTER — Encounter: Payer: 59 | Admitting: Internal Medicine

## 2023-03-26 ENCOUNTER — Telehealth: Payer: Self-pay

## 2023-03-26 NOTE — Progress Notes (Deleted)
Subjective:   Grace Becker 20-Dec-1995  03/26/2023   CC: No chief complaint on file.   HPI: Grace Becker is a 27 y.o. female who presents for a routine health maintenance exam.  Labs collected at time of visit.    HEALTH SCREENINGS: - Pap smear: {Blank single:19197::"pap done","not applicable","up to date","done elsewhere"} - Mammogram (40+): Not applicable  - Colonoscopy (45+): Not applicable  - Bone Density (65+): Not applicable  - Lung CA screening with low-dose CT:  Not applicable Adults age 39-80 who are current cigarette smokers or quit within the last 15 years. Must have 20 pack year history.   Depression and Anxiety Screen done today and results listed below:     12/24/2022    4:19 PM 03/04/2020    1:43 PM 09/12/2019    9:53 AM 08/31/2019   11:50 AM 08/25/2019   12:12 PM  Depression screen PHQ 2/9  Decreased Interest 0 0 0 0 0  Down, Depressed, Hopeless 0 0 0 0 0  PHQ - 2 Score 0 0 0 0 0  Altered sleeping 0  0 0 0  Tired, decreased energy 3  0 0 0  Change in appetite 0  0 0 0  Feeling bad or failure about yourself  0  0 0 0  Trouble concentrating 0  0 0 0  Moving slowly or fidgety/restless 0  0 0 0  Suicidal thoughts 0  0 0 0  PHQ-9 Score 3  0 0 0  Difficult doing work/chores Not difficult at all          12/24/2022    4:19 PM 09/12/2019    9:54 AM 08/31/2019   11:50 AM 08/25/2019   12:12 PM  GAD 7 : Generalized Anxiety Score  Nervous, Anxious, on Edge 1 0 0 0  Control/stop worrying 1 0 0 0  Worry too much - different things 1 0 0 0  Trouble relaxing 1 0 0 0  Restless 1 0 0 0  Easily annoyed or irritable 1 0 0 0  Afraid - awful might happen 1 0 0 0  Total GAD 7 Score 7 0 0 0  Anxiety Difficulty Somewhat difficult       IMMUNIZATIONS: - Tdap: Tetanus vaccination status reviewed: last tetanus booster within 10 years. - Influenza: {Blank single:19197::"Up to date","Administered today","Postponed to flu season","Refused","Given  elsewhere"}   Past medical history, surgical history, medications, allergies, family history and social history reviewed with patient today and changes made to appropriate areas of the chart.   Past Medical History:  Diagnosis Date   Anxiety    History of pregnancy induced hypertension 08/23/2019   Postpartum after 1st child  [ ]  Aspirin 81 mg daily after 12 weeks Current antihypertensives:  None   Baseline and surveillance labs (pulled in from [facility], refresh links as needed)  Lab Results Component Value Date  PLT 225 08/25/2019  CREATININE 0.47 (L) 08/25/2019  AST 23 08/25/2019  ALT 10 08/25/2019  PROTCRRATIO 0.79 (H) 09/11/2016   Antenatal Testing CHTN - O10.919  Group I   BP < 140/90, no m   Preeclampsia in postpartum period 11/24/2019   Pregnancy induced hypertension     Past Surgical History:  Procedure Laterality Date   NO PAST SURGERIES      Current Outpatient Medications on File Prior to Visit  Medication Sig   escitalopram (LEXAPRO) 5 MG tablet Take 5 mg by mouth daily.   fluconazole (DIFLUCAN) 150 MG tablet Take 1  tablet by mouth once. Repeat dose in 3 days if symptoms persist. (Patient not taking: Reported on 02/11/2023)   Norethindrone Acetate-Ethinyl Estrad-FE (LOESTRIN 24 FE) 1-20 MG-MCG(24) tablet Take 1 tablet by mouth daily. Skip non-hormone days and start a new pack to allow for continuous suppression.   No current facility-administered medications on file prior to visit.    No Known Allergies   Social History   Socioeconomic History   Marital status: Single    Spouse name: Not on file   Number of children: Not on file   Years of education: Not on file   Highest education level: 12th grade  Occupational History   Not on file  Tobacco Use   Smoking status: Never   Smokeless tobacco: Never  Vaping Use   Vaping status: Never Used  Substance and Sexual Activity   Alcohol use: No   Drug use: No   Sexual activity: Yes    Birth control/protection: None  Other  Topics Concern   Not on file  Social History Narrative   Not on file   Social Determinants of Health   Financial Resource Strain: Low Risk  (12/22/2022)   Overall Financial Resource Strain (CARDIA)    Difficulty of Paying Living Expenses: Not hard at all  Food Insecurity: No Food Insecurity (12/22/2022)   Hunger Vital Sign    Worried About Running Out of Food in the Last Year: Never true    Ran Out of Food in the Last Year: Never true  Transportation Needs: No Transportation Needs (12/22/2022)   PRAPARE - Administrator, Civil Service (Medical): No    Lack of Transportation (Non-Medical): No  Physical Activity: Unknown (12/22/2022)   Exercise Vital Sign    Days of Exercise per Week: 0 days    Minutes of Exercise per Session: Not on file  Stress: No Stress Concern Present (12/22/2022)   [employer] of Occupational Health - Occupational Stress Questionnaire    Feeling of Stress : Only a little  Social Connections: Socially Isolated (12/22/2022)   Social Connection and Isolation Panel [NHANES]    Frequency of Communication with Friends and Family: More than three times a week    Frequency of Social Gatherings with Friends and Family: More than three times a week    Attends Religious Services: Never    Database administrator or Organizations: No    Attends Engineer, structural: Not on file    Marital Status: Never married  Catering manager Violence: Not on file   Social History   Tobacco Use  Smoking Status Never  Smokeless Tobacco Never   Social History   Substance and Sexual Activity  Alcohol Use No    Family History  Problem Relation Age of Onset   Hypertension Other    Anxiety disorder Father      ROS: Denies fever, fatigue, unexplained weight loss/gain, hearing or vision changes, cardiac or respiratory complaints. Denies neurological deficits, musculoskeletal complaints, gastrointestinal or genitourinary complaints, mental health complaints,  and skin changes.   Objective:   There were no vitals filed for this visit.  GENERAL APPEARANCE: Well-appearing, in NAD. Well nourished.  SKIN: Pink, warm and dry. Turgor normal. No rash, lesion, ulceration, or ecchymoses. Hair evenly distributed.  HEENT: HEAD: Normocephalic.  EYES: PERRLA. EOMI. Lids intact w/o defect. Sclera white, Conjunctiva pink w/o exudate.  EARS: External ear w/o redness, swelling, masses or lesions. EAC clear. TM's intact, translucent w/o bulging, appropriate landmarks visualized. Appropriate acuity to  conversational tones.  NOSE: Septum midline w/o deformity. Nares patent, mucosa pink and non-inflamed w/o drainage. No sinus tenderness.  THROAT: Uvula midline. Oropharynx clear. Tonsils non-inflamed w/o exudate ***. Oral mucosa pink and moist.  NECK: Supple, Trachea midline. Full ROM w/o pain or tenderness. No lymphadenopathy. Thyroid non-tender w/o enlargement or palpable masses.  BREASTS: Breasts pendulous, symmetrical, and w/o palpable masses. Nipples everted and w/o discharge. No rash or skin retraction. No axillary or supraclavicular lymphadenopathy.  RESPIRATORY: Chest wall symmetrical w/o masses. Respirations even and non-labored. Breath sounds clear to auscultation bilaterally. No wheezes, rales, rhonchi, or crackles. CARDIAC: S1, S2 present, regular rate and rhythm. No gallops, murmurs, rubs, or clicks. No carotid bruits. Capillary refill <2 seconds. Peripheral pulses 2+ bilaterally. GI: Abdomen soft w/o distention. Normoactive bowel sounds. No palpable masses or tenderness. No guarding or rebound tenderness. Liver and spleen w/o tenderness or enlargement. No CVA tenderness.  GU: *** External genitalia without erythema, lesions, or masses. No lymphadenopathy. Vaginal mucosa pink and moist without exudate, lesions, or ulcerations. Cervix pink without discharge. Cervical os closed. Uterus and adnexae palpable, not enlarged, and w/o tenderness. No palpable masses.   MSK: Muscle tone and strength appropriate for age, w/o atrophy or abnormal movement.  EXTREMITIES: Active ROM intact, w/o tenderness, crepitus, or contracture. No obvious joint deformities or effusions. No clubbing, edema, or cyanosis.  NEUROLOGIC: CN's II-XII intact. Motor strength symmetrical with no obvious weakness. No sensory deficits. DTR's 2+ symmetric bilaterally. Steady, even gait.  PSYCH/MENTAL STATUS: Alert, oriented x 3. Cooperative, appropriate mood and affect.   Chaperoned by Mary Sella CMA ***  Assessment & Plan:  There are no diagnoses linked to this encounter.  No orders of the defined types were placed in this encounter.   PATIENT COUNSELING:  - Encouraged a healthy well-balanced diet. Patient may adjust caloric intake to maintain or achieve ideal body weight. May reduce intake of dietary saturated fat and total fat and have adequate dietary potassium and calcium preferably from fresh fruits, vegetables, and low-fat dairy products.   - Advised to avoid cigarette smoking. - Discussed with the patient that most people either abstain from alcohol or drink within safe limits (<=14/week and <=4 drinks/occasion for males, <=7/weeks and <= 3 drinks/occasion for females) and that the risk for alcohol disorders and other health effects rises proportionally with the number of drinks per week and how often a drinker exceeds daily limits. - Discussed cessation/primary prevention of drug use and availability of treatment for abuse.  - Discussed sexually transmitted diseases, avoidance of unintended pregnancy and contraceptive alternatives.  - Stressed the importance of regular exercise - Injury prevention: Discussed safety belts, safety helmets, smoke detector, smoking near bedding or upholstery.  - Dental health: Discussed importance of regular tooth brushing, flossing, and dental visits.   NEXT PREVENTATIVE PHYSICAL DUE IN 1 YEAR.  No follow-ups on file.  Salvatore Decent, FNP

## 2023-03-26 NOTE — Telephone Encounter (Signed)
 Pt did not show up for appt. No letter sent yet. Pls advise.

## 2023-03-30 NOTE — Telephone Encounter (Signed)
1st no show, letter sent via [facility]

## 2023-03-31 NOTE — Telephone Encounter (Signed)
Provider aware

## 2023-05-07 ENCOUNTER — Encounter: Payer: Self-pay | Admitting: Internal Medicine

## 2023-11-25 ENCOUNTER — Other Ambulatory Visit: Payer: Self-pay | Admitting: Family Medicine
